# Patient Record
Sex: Female | Born: 1990 | Race: Black or African American | Hispanic: No | Marital: Single | Smoking: Never smoker
Health system: Southern US, Community
[De-identification: ages and names within clinical notes are randomized; demographics above are authoritative.]

## PROBLEM LIST (undated history)

## (undated) DIAGNOSIS — E119 Type 2 diabetes mellitus without complications: Secondary | ICD-10-CM

## (undated) DIAGNOSIS — I10 Essential (primary) hypertension: Secondary | ICD-10-CM

---

## 2019-04-21 ENCOUNTER — Emergency Department (HOSPITAL_COMMUNITY)
Admission: EM | Admit: 2019-04-21 | Discharge: 2019-04-21 | Disposition: A | Discharge status: Home / Self Care | Payer: Managed Care, Other (non HMO) | Attending: Emergency Medicine | Admitting: Emergency Medicine

## 2019-04-21 ENCOUNTER — Encounter (HOSPITAL_COMMUNITY): Payer: Self-pay | Admitting: Emergency Medicine

## 2019-04-21 ENCOUNTER — Other Ambulatory Visit: Payer: Self-pay

## 2019-04-21 ENCOUNTER — Emergency Department (HOSPITAL_COMMUNITY): Payer: Managed Care, Other (non HMO)

## 2019-04-21 DIAGNOSIS — R739 Hyperglycemia, unspecified: Secondary | ICD-10-CM

## 2019-04-21 DIAGNOSIS — E1310 Other specified diabetes mellitus with ketoacidosis without coma: Secondary | ICD-10-CM

## 2019-04-21 DIAGNOSIS — R000 Tachycardia, unspecified: Secondary | ICD-10-CM | POA: Diagnosis present

## 2019-04-21 DIAGNOSIS — E1165 Type 2 diabetes mellitus with hyperglycemia: Secondary | ICD-10-CM | POA: Insufficient documentation

## 2019-04-21 DIAGNOSIS — E109 Type 1 diabetes mellitus without complications: Secondary | ICD-10-CM

## 2019-04-21 DIAGNOSIS — Z79899 Other long term (current) drug therapy: Secondary | ICD-10-CM | POA: Diagnosis not present

## 2019-04-21 HISTORY — DX: Type 2 diabetes mellitus without complications: E11.9

## 2019-04-21 LAB — BLOOD GAS, VENOUS
Acid-base deficit: 1.3 mmol/L (ref 0.0–2.0)
Bicarbonate: 22.5 mmol/L (ref 20.0–28.0)
FIO2: 100
O2 Saturation: 72.1 %
Patient temperature: 98.6
pCO2, Ven: 37 mmHg — ABNORMAL LOW (ref 44.0–60.0)
pH, Ven: 7.401 (ref 7.250–7.430)
pO2, Ven: 39.4 mmHg (ref 32.0–45.0)

## 2019-04-21 LAB — CBC WITH DIFFERENTIAL/PLATELET
Abs Immature Granulocytes: 0.03 10*3/uL (ref 0.00–0.07)
Basophils Absolute: 0 10*3/uL (ref 0.0–0.1)
Basophils Relative: 0 %
Eosinophils Absolute: 0 10*3/uL (ref 0.0–0.5)
Eosinophils Relative: 0 %
HCT: 42.5 % (ref 36.0–46.0)
Hemoglobin: 14.6 g/dL (ref 12.0–15.0)
Immature Granulocytes: 0 %
Lymphocytes Relative: 16 %
Lymphs Abs: 1.7 10*3/uL (ref 0.7–4.0)
MCH: 28.6 pg (ref 26.0–34.0)
MCHC: 34.4 g/dL (ref 30.0–36.0)
MCV: 83.2 fL (ref 80.0–100.0)
Monocytes Absolute: 0.4 10*3/uL (ref 0.1–1.0)
Monocytes Relative: 4 %
Neutro Abs: 8.2 10*3/uL — ABNORMAL HIGH (ref 1.7–7.7)
Neutrophils Relative %: 80 %
Platelets: 409 10*3/uL — ABNORMAL HIGH (ref 150–400)
RBC: 5.11 MIL/uL (ref 3.87–5.11)
RDW: 11.7 % (ref 11.5–15.5)
WBC: 10.3 10*3/uL (ref 4.0–10.5)
nRBC: 0 % (ref 0.0–0.2)

## 2019-04-21 LAB — COMPREHENSIVE METABOLIC PANEL
ALT: 15 U/L (ref 0–44)
AST: 18 U/L (ref 15–41)
Albumin: 4.2 g/dL (ref 3.5–5.0)
Alkaline Phosphatase: 77 U/L (ref 38–126)
Anion gap: 11 (ref 5–15)
BUN: 11 mg/dL (ref 6–20)
CO2: 22 mmol/L (ref 22–32)
Calcium: 9.8 mg/dL (ref 8.9–10.3)
Chloride: 100 mmol/L (ref 98–111)
Creatinine, Ser: 0.78 mg/dL (ref 0.44–1.00)
GFR calc Af Amer: >60 mL/min (ref >60)
GFR calc non Af Amer: >60 mL/min (ref >60)
Glucose, Bld: 299 mg/dL — ABNORMAL HIGH (ref 70–99)
Potassium: 3.6 mmol/L (ref 3.5–5.1)
Sodium: 133 mmol/L — ABNORMAL LOW (ref 135–145)
Total Bilirubin: 0.9 mg/dL (ref 0.3–1.2)
Total Protein: 9.1 g/dL — ABNORMAL HIGH (ref 6.5–8.1)

## 2019-04-21 LAB — URINALYSIS, ROUTINE W REFLEX MICROSCOPIC
Bilirubin Urine: NEGATIVE
Glucose, UA: >=500 mg/dL — AB
Ketones, ur: 80 mg/dL — AB
Nitrite: NEGATIVE
Protein, ur: 100 mg/dL — AB
Specific Gravity, Urine: 1.022 (ref 1.005–1.030)
pH: 5 (ref 5.0–8.0)

## 2019-04-21 LAB — BETA-HYDROXYBUTYRIC ACID: Beta-Hydroxybutyric Acid: 1.49 mmol/L — ABNORMAL HIGH (ref 0.05–0.27)

## 2019-04-21 LAB — I-STAT BETA HCG BLOOD, ED (MC, WL, AP ONLY): I-stat hCG, quantitative: <5 m[IU]/mL (ref <5)

## 2019-04-21 LAB — CBG MONITORING, ED
Glucose-Capillary: 234 mg/dL — ABNORMAL HIGH (ref 70–99)
Glucose-Capillary: 238 mg/dL — ABNORMAL HIGH (ref 70–99)

## 2019-04-21 LAB — TSH: TSH: 0.461 u[IU]/mL (ref 0.350–4.500)

## 2019-04-21 MED ORDER — GLUCAGON EMERGENCY 1 MG IJ KIT: 1.0000 mg | PACK | INTRAMUSCULAR | 0 refills | Status: AC | PRN

## 2019-04-21 MED ORDER — INSULIN DETEMIR 100 UNIT/ML ~~LOC~~ SOLN: 30.0000 [IU] | Freq: Every day | SUBCUTANEOUS | 4 refills | Status: DC

## 2019-04-21 MED ORDER — INSULIN LISPRO 100 UNIT/ML ~~LOC~~ SOLN: 5.0000 [IU] | Freq: Three times a day (TID) | SUBCUTANEOUS | 4 refills | Status: DC

## 2019-04-21 MED ORDER — INSULIN ASPART 100 UNIT/ML ~~LOC~~ SOLN
5.0000 [IU] | Freq: Once | SUBCUTANEOUS | Status: AC
  Administered 2019-04-21: 5 [IU] via SUBCUTANEOUS
  Filled 2019-04-21: qty 0.05

## 2019-04-21 MED ORDER — SODIUM CHLORIDE 0.9 % IV BOLUS
1000.0000 mL | Freq: Once | INTRAVENOUS | Status: AC
  Administered 2019-04-21: 1000 mL via INTRAVENOUS

## 2019-04-21 NOTE — ED Provider Notes (Signed)
Portal DEPT Provider Note   CSN: [id number] Arrival date & time: 04/21/19  1429     History Chief Complaint  Patient presents with  . Tachycardia    Erica Buchanan is a 29 y.o. female with past medical history of DM 1 who presents today for evaluation of tachycardia and hyperglycemia. She reports that over the past few months she has been rationing her Humalog and Lantus to attempt to make them last longer. She states that her sugars have been running in the 2-3 100s today which is abnormal for her.  She reports that last Tuesday she was in the shower and felt like her heart was beating quickly.  She states that she checked her heart rate and it has been elevated intermittently since. She states that she is new to the area, however does not have a PCP here.  She denies any fevers or shortness of breath.  She does not take any birth control, no prior history of VTE.  She reports that she feels anxious overall.  No N/V/D.  No shortness of breath.   She states that she checked her ketones at home and they were positive.   HPI     Past Medical History:  Diagnosis Date  . Diabetes mellitus without complication ([place])    Type 1    There are no problems to display for this patient.   History reviewed. No pertinent surgical history.   OB History   No obstetric history on file.     No family history on file.  Social History   Tobacco Use  . Smoking status: Not on file  Substance Use Topics  . Alcohol use: Not on file  . Drug use: Not on file    Home Medications Prior to Admission medications   Medication Sig Start Date End Date Taking? Authorizing Provider  insulin detemir (LEVEMIR) 100 UNIT/ML injection Inject 30 Units into the skin at bedtime.   Yes [provider]  insulin lispro (HUMALOG) 100 UNIT/ML injection Inject 5-8 Units into the skin 3 (three) times daily before meals. Sliding scale: 5-8 units depending on the  size of the meal   Yes [provider]  Multiple Vitamin (MULTIVITAMIN ADULT PO) Take 1 tablet by mouth daily.   Yes [provider]    Allergies    Patient has no known allergies.  Review of Systems   Review of Systems  Constitutional: Negative for chills, fatigue and fever.  HENT: Negative for congestion.   Respiratory: Negative for cough, chest tightness and shortness of breath.   Cardiovascular: Positive for palpitations. Negative for chest pain and leg swelling.  Gastrointestinal: Negative for abdominal pain, diarrhea, nausea and vomiting.  Endocrine: Positive for polydipsia and polyuria.  Genitourinary: Negative for dysuria, pelvic pain and urgency.  Musculoskeletal: Negative for back pain and neck pain.  Neurological: Negative for weakness and headaches.  Psychiatric/Behavioral: Negative for confusion. The patient is not hyperactive.   All other systems reviewed and are negative.   Physical Exam Updated Vital Signs BP (!) 142/94   Pulse 98   Temp 97.7 F (36.5 C) (Oral)   Resp 16   LMP 03/24/2019   SpO2 100%   Physical Exam Vitals and nursing note reviewed.  Constitutional:      General: She is not in acute distress.    Appearance: She is well-developed. She is not diaphoretic.  HENT:     Head: Normocephalic and atraumatic.     Mouth/Throat:  Mouth: Mucous membranes are moist.  Eyes:     General: No scleral icterus.       Right eye: No discharge.        Left eye: No discharge.     Conjunctiva/sclera: Conjunctivae normal.  Cardiovascular:     Rate and Rhythm: Regular rhythm. Tachycardia present.     Pulses: Normal pulses.     Heart sounds: Normal heart sounds.  Pulmonary:     Effort: Pulmonary effort is normal. No respiratory distress.     Breath sounds: Normal breath sounds. No stridor.  Abdominal:     General: Abdomen is flat. There is no distension.     Tenderness: There is no abdominal tenderness.  Musculoskeletal:         General: No deformity.     Cervical back: Normal range of motion.     Right lower leg: No edema.     Left lower leg: No edema.  Skin:    General: Skin is warm and dry.  Neurological:     Mental Status: She is alert.     Motor: No abnormal muscle tone.  Psychiatric:        Attention and Perception: Attention normal.        Mood and Affect: Affect normal. Mood is anxious.        Behavior: Behavior normal.        Thought Content: Thought content normal.     ED Results / Procedures / Treatments   Labs (all labs ordered are listed, but only abnormal results are displayed) Labs Reviewed  URINALYSIS, ROUTINE W REFLEX MICROSCOPIC - Abnormal; Notable for the following components:      Result Value   APPearance CLOUDY (*)    Glucose, UA >=500 (*)    Hgb urine dipstick SMALL (*)    Ketones, ur 80 (*)    Protein, ur 100 (*)    Leukocytes,Ua SMALL (*)    Bacteria, UA RARE (*)    All other components within normal limits  COMPREHENSIVE METABOLIC PANEL - Abnormal; Notable for the following components:   Sodium 133 (*)    Glucose, Bld 299 (*)    Total Protein 9.1 (*)    All other components within normal limits  CBC WITH DIFFERENTIAL/PLATELET - Abnormal; Notable for the following components:   Platelets 409 (*)    Neutro Abs 8.2 (*)    All other components within normal limits  BLOOD GAS, VENOUS - Abnormal; Notable for the following components:   pCO2, Ven 37.0 (*)    All other components within normal limits  BETA-HYDROXYBUTYRIC ACID - Abnormal; Notable for the following components:   Beta-Hydroxybutyric Acid 1.49 (*)    All other components within normal limits  CBG MONITORING, ED - Abnormal; Notable for the following components:   Glucose-Capillary 234 (*)    All other components within normal limits  CBG MONITORING, ED - Abnormal; Notable for the following components:   Glucose-Capillary 238 (*)    All other components within normal limits  TSH  I-STAT BETA HCG BLOOD, ED ([facility],  [facility], [facility] ONLY)    EKG EKG Interpretation  Date/Time:  Sunday April 21 2019 14:50:41 EST Ventricular Rate:  114 PR Interval:    QRS Duration: 86 QT Interval:  344 QTC Calculation: 474 R Axis:   75 Text Interpretation: Sinus tachycardia Probable left atrial enlargement No acute changes No old tracing to compare Confirmed by Varney Biles [phone]) on 04/21/2019 4:15:46 PM   Radiology DG Chest  Port 1 View  Result Date: 04/21/2019 CLINICAL DATA:  Tachycardia and hyperglycemia EXAM: PORTABLE CHEST 1 VIEW COMPARISON:  None. FINDINGS: No consolidation, features of edema, pneumothorax, or effusion. Pulmonary vascularity is normally distributed. The cardiomediastinal contours are unremarkable. No acute osseous or soft tissue abnormality. IMPRESSION: No acute cardiopulmonary abnormality. Electronically Signed   By: Lovena Le M.D.   On: 04/21/2019 18:54    Procedures Procedures (including critical care time)  Medications Ordered in ED Medications  sodium chloride 0.9 % bolus 1,000 mL (0 mLs Intravenous Stopped 04/21/19 1728)  insulin aspart (novoLOG) injection 5 Units (5 Units Subcutaneous Given 04/21/19 1946)    ED Course  I have reviewed the triage vital signs and the nursing notes.  Pertinent labs & imaging results that were available during my care of the patient were reviewed by me and considered in my medical decision making (see chart for details).    MDM Rules/Calculators/A&P                     Patient presents today for evaluation of high heart rates. She reports that she has not been taking her full dose of insulin as directed as she has been rationing it over the past many months due to fear of running out.  She states that she uses a long-acting insulin and then will correct her blood sugar with a sliding scale insulin.  She does not consistently count carbs or dose insulin based on the food she is consuming.  On exam she is overall well-appearing.  She is slightly  tachycardic, however she reports feeling very anxious, stating that oftentimes when she is at the doctor her blood pressure and heart rate go up. 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  Labs are obtained and reviewed, CBC without significant abnormalities.  UA shows over 500 glucose with 80 ketones.  Urine has rare bacteria and 6-10 squamous epithelial cells.  She is not having dysuria, and I suspect this is contamination.  CMP shows glucose of 299 with a normal CO2.  VBG shows a pH of 7.401 with a PCO2 at 37 which is slightly low.  Beta hydroxybutyric acid is slightly elevated at 1.49.  Pregnancy test is negative.  She was treated with 1 L of IV fluids, after which she was able to eat and drink in the emergency room and she was given a subcu dose of NovoLog at her usual dose.  She does not take any birth control, does not have any leg swelling, chest pain, shortness of breath therefore low suspicion for VTE.  She is given a ambulatory referral to endocrinology as she does not currently have an endocrinologist.  She is given refills on her insulin with instructions on checking her blood sugar regularly and the importance of taking her insulin appropriately.  She is also given a glucagon emergency kit with instructions on when it should be used.   While she does have ketosis her pH is normal and she does not appear acidotic.  I discussed with patient that while she does have some signs of DKA she is not truly acidotic and therefore would most likely not benefit from an admission.  Discussed that she needs to continue her treatment at home with increased water intake, taking her insulin appropriately and checking her sugar.  Return precautions  were discussed with patient who states their understanding.  At the time of discharge patient denied any unaddressed  complaints or concerns.  Patient is agreeable for discharge home.  Note: Portions of this report may have been transcribed using voice recognition software. Every effort was made to ensure accuracy; however, inadvertent computerized transcription errors may be present  This patient was discussed with Dr. Kathrynn Humble.  Final Clinical Impression(s) / ED Diagnoses Final diagnoses:  Hyperglycemia  Ketosis due to diabetes ([place])  Type 1 diabetes mellitus without complication [facility])    Rx / DC Orders ED Discharge Orders         Ordered    Ambulatory referral to Endocrinology     04/21/19 2018           Ollen Gross 04/21/19 2108    Varney Biles, MD 04/21/19 2203

## 2019-04-21 NOTE — Discharge Instructions (Addendum)
Please keep a log of the carbs you are eating, your blood sugars, and how much insulin you are taking.   Today I gave you a prescription for a glucagon emergency kit.  This is something that you can teach someone else how to use.  This should be used if you are hypoglycemic and are not awake or able to safely take sugar by mouth.

## 2019-04-21 NOTE — ED Triage Notes (Signed)
Patient c/o tachycardia and hyperglycemia today. Hx Type 1 diabetes. States took extra insulin and last CBG was 181. Patient also adds hx anxiety. Denies SOB, chest pain, fever.

## 2019-05-09 ENCOUNTER — Other Ambulatory Visit: Payer: Self-pay

## 2019-05-13 ENCOUNTER — Other Ambulatory Visit: Payer: Self-pay

## 2019-05-13 ENCOUNTER — Ambulatory Visit (INDEPENDENT_AMBULATORY_CARE_PROVIDER_SITE_OTHER): Payer: Managed Care, Other (non HMO) | Admitting: Internal Medicine

## 2019-05-13 ENCOUNTER — Encounter: Payer: Self-pay | Admitting: Internal Medicine

## 2019-05-13 VITALS — BP 142/82 | HR 108 | Temp 99.1°F | Wt 190.2 lb

## 2019-05-13 DIAGNOSIS — E1065 Type 1 diabetes mellitus with hyperglycemia: Secondary | ICD-10-CM

## 2019-05-13 DIAGNOSIS — R739 Hyperglycemia, unspecified: Secondary | ICD-10-CM | POA: Diagnosis not present

## 2019-05-13 LAB — POCT GLYCOSYLATED HEMOGLOBIN (HGB A1C): Hemoglobin A1C: 10.5 % — AB (ref 4.0–5.6)

## 2019-05-13 MED ORDER — LANTUS SOLOSTAR 100 UNIT/ML ~~LOC~~ SOPN: 28.0000 [IU] | PEN_INJECTOR | Freq: Every day | SUBCUTANEOUS | 6 refills | Status: DC

## 2019-05-13 MED ORDER — INSULIN LISPRO (1 UNIT DIAL) 100 UNIT/ML (KWIKPEN): 10.0000 [IU] | PEN_INJECTOR | Freq: Three times a day (TID) | SUBCUTANEOUS | 6 refills | Status: DC

## 2019-05-13 NOTE — Patient Instructions (Addendum)
-   Stop Levemir - Start Lantus 28 units daily  - Start Humalog at 10 units with each meal  - Humalog correctional insulin: ADD extra units on insulin to your meal-time humalog dose if your blood sugars are higher than  Use the scale below to help guide you:   Blood sugar before meal Number of units to inject  Less than 165 0 unit  166 -  200 1 units  201 -  235 2 units  236 -  270 3 units  271 -  305 4 units  306 -  340 5 units  341 -  375 6 units  376-  410 7 units     Choose healthy, lower carb lower calorie snacks: toss salad, vegetables, cottage cheese, peanut butter, low fat cheese / string cheese, lower sodium deli meat, tuna salad or chicken salad    HOW TO TREAT LOW BLOOD SUGARS (Blood sugar LESS THAN 70 MG/DL)  Please follow the RULE OF 15 for the treatment of hypoglycemia treatment (when your (blood sugars are less than 70 mg/dL)    STEP 1: Take 15 grams of carbohydrates when your blood sugar is low, which includes:   3-4 GLUCOSE TABS  OR  3-4 OZ OF JUICE OR REGULAR SODA OR  ONE TUBE OF GLUCOSE GEL     STEP 2: RECHECK blood sugar in 15 MINUTES STEP 3: If your blood sugar is still low at the 15 minute recheck --> then, go back to STEP 1 and treat AGAIN with another 15 grams of carbohydrates.

## 2019-05-13 NOTE — Progress Notes (Signed)
Name: Erica Buchanan  MRN/ DOB: [id number], [birth year]   Age/ Sex: 29 y.o., female    PCP: Patient, No Pcp Per   Reason for Endocrinology Evaluation: Type 1 Diabetes Mellitus     Date of Initial Endocrinology Visit: 05/14/2019     PATIENT IDENTIFIER: Erica Buchanan is a 29 y.o. female with a past medical history of T1DM. The patient presented for initial endocrinology clinic visit on 05/14/2019 for consultative assistance with her diabetes management.    HPI: Erica Buchanan was    Diagnosed with T1DM at age 30 Prior Medications tried/Intolerance: Metformin - nausea Currently checking blood sugars 2 x / day Hypoglycemia episodes : no           Hemoglobin A1c has ranged from  In7's.% , peaking at 9.0%  Patient required assistance for hypoglycemia: no  Patient has required hospitalization within the last 1 year from hyper or hypoglycemia:04/2019 due to high BP No prior diagnosis of DKA  In terms of diet, the patient 2 meals a day , snacks at times. Drinks sweet tea at times  Moved from [place], [place] since 2018   Works at [employer] with brother.   HOME DIABETES REGIMEN: Levemir  30 units  Humalog 3-8 units SS   Statin:no  ACE-I/ARB:no  Prior Diabetic Education: no   METER DOWNLOAD SUMMARY: Did not bring    DIABETIC COMPLICATIONS: Microvascular complications:    Denies: neuropathy, CKD, retinopathy   Last eye exam: Completed 2017  Macrovascular complications:    Denies: CAD, PVD, CVA   PAST HISTORY: Past Medical History:  Past Medical History:  Diagnosis Date  . Diabetes mellitus without complication ([place])    Type 1    Past Surgical History: No past surgical history on file.   Social History:  reports that she has never smoked. She has never used smokeless tobacco. She reports current alcohol use. She reports that she does not use drugs. Family History:  Family History  Problem Relation Age of Onset  . Healthy Mother    . Diabetes Father      HOME MEDICATIONS: Allergies as of 05/13/2019   No Known Allergies     Medication List       Accurate as of May 13, 2019 11:59 PM. If you have any questions, ask your nurse or doctor.        STOP taking these medications   insulin detemir 100 UNIT/ML injection Commonly known as: LEVEMIR Stopped by: Dorita Sciara, MD   insulin lispro 100 UNIT/ML injection Commonly known as: HUMALOG Replaced by: insulin lispro 100 UNIT/ML KwikPen Stopped by: Dorita Sciara, MD     TAKE these medications   Glucagon Emergency 1 MG Kit Inject 1 mg as directed as needed (Hypoglycemia with altered level of consciousness, then call 911).   insulin lispro 100 UNIT/ML KwikPen Commonly known as: HumaLOG KwikPen Inject 0.1 mLs (10 Units total) into the skin 3 (three) times daily. Max daily dose of 55 units Replaces: insulin lispro 100 UNIT/ML injection Started by: Dorita Sciara, MD   Lantus SoloStar 100 UNIT/ML Solostar Pen Generic drug: Insulin Glargine Inject 28 Units into the skin daily. Started by: Dorita Sciara, MD   MULTIVITAMIN ADULT PO Take 1 tablet by mouth daily.        ALLERGIES: No Known Allergies   REVIEW OF SYSTEMS: A comprehensive ROS was conducted with the patient and is negative except as per HPI and below:  ROS    OBJECTIVE:   VITAL SIGNS: BP (!) 142/82 (BP Location: Left Arm, Patient Position: Sitting, Cuff Size: Normal)   Pulse (!) 108   Temp 99.1 F (37.3 C)   Wt 190 lb 3.2 oz (86.3 kg)   SpO2 98%   BMI 30.70 kg/m    PHYSICAL EXAM:  General: Pt appears well and is in NAD  HEENT:  Eyes: External eye exam normal without stare, lid lag or exophthalmos.  EOM intact.    Neck: General: Supple without adenopathy or carotid bruits. Thyroid: Thyroid size enlarged .No nodules appreciated.  Lungs: Clear with good BS bilat with no rales, rhonchi, or wheezes  Heart: RRR with normal S1 and S2 and no gallops; no  murmurs; no rub  Abdomen: Normoactive bowel sounds, soft, nontender, without masses or organomegaly palpable  Extremities:  Lower extremities - No pretibial edema. No lesions.  Skin: Normal texture and temperature to palpation.   Neuro: MS is good with appropriate affect, pt is alert and Ox3    DM foot exam: 05/13/2019  The skin of the feet is intact without sores or ulcerations. The pedal pulses are 2+ on right and 2+ on left. The sensation is intact to a screening 5.07, 10 gram monofilament bilaterally   DATA REVIEWED:  Lab Results  Component Value Date   HGBA1C 10.5 (A) 05/13/2019   Lab Results  Component Value Date   CREATININE 0.78 04/21/2019    ASSESSMENT / PLAN / RECOMMENDATIONS:   1) Type 1 Diabetes Mellitus, Poorly controlled, complicated by insulin resistance  (double diabetes) - Most recent A1c of 10.5%. Goal A1c < 7.0 %.    Plan: GENERAL: I have discussed with the patient the pathophysiology of diabetes. We went over the natural progression of the disease. We talked about both insulin resistance and insulin deficiency. We stressed the importance of lifestyle changes including diet and exercise. I explained the complications associated with diabetes including retinopathy, nephropathy, neuropathy as well as increased risk of cardiovascular disease. We went over the benefit seen with glycemic control.    I explained to the patient that diabetic patients are at higher than normal risk for amputations. Discussed pharmacokinetics of basal/bolus insulin and the importance of taking prandial insulin with meals.   We also discussed avoiding sugar-sweetened beverages and snacks, when possible.   We will consider adding metformin in the future to improve her insulin resistance  We discussed the importance of having planned pregnancies in the future, we discussed the importance of having an A1c at 7% prior to considering pregnancy.  She is not interested in conception at this  time  We will stop the Levemir and start Lantus due to improved half-life.  MEDICATIONS: - Stop Levemir - Start Lantus 28 units daily  - Start Humalog at 10 units with each meal  - CF: Humalog (BG-130/35)   EDUCATION / INSTRUCTIONS:  BG monitoring instructions: Patient is instructed to check her blood sugars 4 times a day, before meals and bedtime.  Call [facility] if: BG persistently < 70 or > 300. . I reviewed the Rule of 15 for the treatment of hypoglycemia in detail with the patient. Literature supplied.   2) Diabetic complications:   Eye: Does not have known diabetic retinopathy.  The patient urge to have an eye exam ASAP  Neuro/ Feet: Does not have known diabetic peripheral neuropathy.  Renal: Patient does not have known baseline CKD. She is not on an ACEI/ARB at present.  At least 45 minutes were spent with the patient today    Signed electronically by: Mack Guise, MD  [facility]  [address] [place], [place] [address] Phone: [phone] FAX: [phone]   CC: Patient, No Pcp Per No address on file Phone: None  Fax: None    Return to Endocrinology clinic as below: Future Appointments  Date Time Provider [place]  08/12/2019  1:40 PM Thetis Schwimmer, Melanie Crazier, MD [facility] None

## 2019-05-14 ENCOUNTER — Encounter: Payer: Self-pay | Admitting: Internal Medicine

## 2019-05-14 DIAGNOSIS — E1065 Type 1 diabetes mellitus with hyperglycemia: Secondary | ICD-10-CM | POA: Insufficient documentation

## 2019-06-17 LAB — HM DIABETES EYE EXAM

## 2019-08-12 ENCOUNTER — Encounter: Payer: Self-pay | Admitting: Internal Medicine

## 2019-08-12 ENCOUNTER — Ambulatory Visit (INDEPENDENT_AMBULATORY_CARE_PROVIDER_SITE_OTHER): Payer: Managed Care, Other (non HMO) | Admitting: Internal Medicine

## 2019-08-12 ENCOUNTER — Other Ambulatory Visit: Payer: Self-pay

## 2019-08-12 VITALS — BP 146/88 | HR 96 | Temp 99.0°F | Ht 66.0 in | Wt 191.2 lb

## 2019-08-12 DIAGNOSIS — E103293 Type 1 diabetes mellitus with mild nonproliferative diabetic retinopathy without macular edema, bilateral: Secondary | ICD-10-CM | POA: Insufficient documentation

## 2019-08-12 DIAGNOSIS — E1065 Type 1 diabetes mellitus with hyperglycemia: Secondary | ICD-10-CM

## 2019-08-12 LAB — POCT GLYCOSYLATED HEMOGLOBIN (HGB A1C): Hemoglobin A1C: 8.4 % — AB (ref 4.0–5.6)

## 2019-08-12 NOTE — Patient Instructions (Signed)
-   Lantus 28 units daily  - Humalog 10 units with each meal  - Humalog for snack : Insulin to carb ratio of 1:15 ( means for every 15 grams of carbohydrate, give yourself 1 units of Humalog )  - Humalog correctional insulin: ADD extra units on insulin to your meal-time humalog dose if your blood sugars are higher than  Use the scale below to help guide you:   Blood sugar before meal Number of units to inject  Less than 165 0 unit  166 -  200 1 units  201 -  235 2 units  236 -  270 3 units  271 -  305 4 units  306 -  340 5 units  341 -  375 6 units  376-  410 7 units     Choose healthy, lower carb lower calorie snacks: toss salad, vegetables, cottage cheese, peanut butter, low fat cheese / string cheese, lower sodium deli meat, tuna salad or chicken salad    HOW TO TREAT LOW BLOOD SUGARS (Blood sugar LESS THAN 70 MG/DL)  Please follow the RULE OF 15 for the treatment of hypoglycemia treatment (when your (blood sugars are less than 70 mg/dL)    STEP 1: Take 15 grams of carbohydrates when your blood sugar is low, which includes:   3-4 GLUCOSE TABS  OR  3-4 OZ OF JUICE OR REGULAR SODA OR  ONE TUBE OF GLUCOSE GEL     STEP 2: RECHECK blood sugar in 15 MINUTES STEP 3: If your blood sugar is still low at the 15 minute recheck --> then, go back to STEP 1 and treat AGAIN with another 15 grams of carbohydrates.

## 2019-08-12 NOTE — Progress Notes (Signed)
 Name: Erica Buchanan  Age/ Sex: 29 y.o., female   MRN/ DOB: [id number], 01/18/1991     PCP: Patient, No Pcp Per   Reason for Endocrinology Evaluation: Type 1 Diabetes Mellitus  Initial Endocrine Consultative Visit: 05/13/2019    PATIENT IDENTIFIER: [name] is a 29 y.o. female with a past medical history of T1DM. The patient has followed with Endocrinology clinic since 05/13/2019 for consultative assistance with management of her diabetes.  DIABETIC HISTORY:  [name] was diagnosed with T1DM at age 11. Metformin caused Nausea. Her hemoglobin A1c has ranged from 7's.% , peaking at 9.0%   Works at [employer]   Lives with brother.    SUBJECTIVE:   During the last visit (05/13/2019): A1c 10.5 %, we adjusted MDI regimen   Today (08/12/2019): [name]  She checks her blood sugars 2times daily, preprandial to breakfast and dinner. The patient has not had hypoglycemic episodes since the last clinic visit.       HOME DIABETES REGIMEN:  Lantus 28 units daily  Humalog 10 units TIDQAC-  Taking 8 units  CF (BG-130/35)       METER DOWNLOAD SUMMARY: Date range evaluated: 4/11-5/01/2020 Fingerstick Blood Glucose Tests = 60 Average Number Tests/Day = 2 Overall Mean FS Glucose = 191 Standard Deviation = 53  BG Ranges: Low = 99 High = 356   Hypoglycemic Events/30 Days: BG < 50 = 0 Episodes of symptomatic severe hypoglycemia = 0    DIABETIC COMPLICATIONS: Microvascular complications:   Mild DR , no edema   Denies: neuropathy, CKD  Last eye exam: Completed 06/17/2019  Macrovascular complications:    Denies: CAD, PVD, CVA    HISTORY:  Past Medical History:  Past Medical History:  Diagnosis Date  . Diabetes mellitus without complication (HCC)    Type 1    Past Surgical History: No past surgical history on file.  Social History:  reports that she has never smoked. She has never used smokeless tobacco. She reports  current alcohol use. She reports that she does not use drugs. Family History:  Family History  Problem Relation Age of Onset  . Healthy Mother   . Diabetes Father      HOME MEDICATIONS: Allergies as of 08/12/2019   No Known Allergies     Medication List       Accurate as of Aug 12, 2019  2:59 PM. If you have any questions, ask your nurse or doctor.        Glucagon Emergency 1 MG Kit Inject 1 mg as directed as needed (Hypoglycemia with altered level of consciousness, then call 911).   insulin lispro 100 UNIT/ML KwikPen Commonly known as: HumaLOG KwikPen Inject 0.1 mLs (10 Units total) into the skin 3 (three) times daily. Max daily dose of 55 units What changed: how much to take   Lantus SoloStar 100 UNIT/ML Solostar Pen Generic drug: insulin glargine Inject 28 Units into the skin daily.   MULTIVITAMIN ADULT PO Take 1 tablet by mouth daily.        OBJECTIVE:   Vital Signs: BP (!) 146/88 (BP Location: Left Arm, Patient Position: Sitting, Cuff Size: Large)   Pulse 96   Temp 99 F (37.2 C)   Ht 5' 6" (1.676 m)   Wt 191 lb 3.2 oz (86.7 kg)   LMP 08/04/2019 (Exact Date)   SpO2 99%   BMI 30.86 kg/m   Wt Readings from Last 3 Encounters:  08/12/19 191 lb 3.2 oz (  86.7 kg)  05/13/19 190 lb 3.2 oz (86.3 kg)  04/21/19 180 lb (81.6 kg)     Exam: General: Pt appears well and is in NAD  Neck: General: Supple without adenopathy. Thyroid: Thyroid size normal.  No goiter or nodules appreciated. No thyroid bruit.  Lungs: Clear with good BS bilat with no rales, rhonchi, or wheezes  Heart: RRR with normal S1 and S2 and no gallops; no murmurs; no rub  Abdomen: Normoactive bowel sounds, soft, nontender, without masses or organomegaly palpable  Extremities: No pretibial edema.   Neuro: MS is good with appropriate affect, pt is alert and Ox3    DM foot exam: 05/13/2019  The skin of the feet is intact without sores or ulcerations. The pedal pulses are 2+ on right and 2+ on  left. The sensation is intact to a screening 5.07, 10 gram monofilament bilaterally     DATA REVIEWED:  Lab Results  Component Value Date   HGBA1C 8.4 (A) 08/12/2019   HGBA1C 10.5 (A) 05/13/2019   Lab Results  Component Value Date   CREATININE 0.78 04/21/2019     ASSESSMENT / PLAN / RECOMMENDATIONS:   1 ) Type 1 Diabetes Mellitus, Poorly controlled with improving glycemic control, complicated by insulin resistance  (double diabetes) - Most recent A1c of 8.4 %. Goal A1c < 7.0 %.   - A1c down from 10.5 %  - Pt with fear of hypoglycemia even when her BG's are in the normal range, which results in her eating/drinking and BG's remaining > 180 mg/dL .  I counseled her about the definition of hypoglycemia with BG's < 70 mg/dL and how to correct tight BG's if she is not comfortable  - She has been snacking at night, she will be provided with a humalog dose for that    MEDICATIONS: - Lantus 28 units daily  - Humalog 10 units with each meal  - Humalog for snack :I: C ratio of 1:15  - CF : Humalog ( BG - 130/35)  EDUCATION / INSTRUCTIONS:  BG monitoring instructions: Patient is instructed to check her blood sugars 3 times a day, before meals and bedtime   Call [facility] if: BG persistently < 70 or > 300. . I reviewed the Rule of 15 for the treatment of hypoglycemia in detail with the patient. Literature supplied.    2) Diabetic complications:   Eye: Does have known diabetic retinopathy.   Neuro/ Feet: Does not have known diabetic peripheral neuropathy.  Renal: Patient does not have known baseline CKD. She is not on an ACEI/ARB at present.  F/U in 3 months    Signed electronically by: Mack Guise, MD  [facility] Endocrinology  [facility] [address] [place], [place] [address] Phone: [phone] FAX: [phone]   CC: Patient, No Pcp Per No address on file Phone: None  Fax: None  Return to Endocrinology  clinic as below: Future Appointments  Date Time Provider [place]  11/18/2019  1:40 PM Willo Yoon, Melanie Crazier, MD [facility] None

## 2019-11-04 ENCOUNTER — Other Ambulatory Visit (HOSPITAL_COMMUNITY)
Admission: RE | Admit: 2019-11-04 | Discharge: 2019-11-04 | Disposition: A | Discharge status: Home / Self Care | Payer: Managed Care, Other (non HMO) | Source: Ambulatory Visit | Attending: Physician Assistant | Admitting: Physician Assistant

## 2019-11-04 DIAGNOSIS — Z124 Encounter for screening for malignant neoplasm of cervix: Secondary | ICD-10-CM | POA: Diagnosis not present

## 2019-11-06 LAB — CYTOLOGY - PAP: Diagnosis: NEGATIVE

## 2019-11-18 ENCOUNTER — Ambulatory Visit: Payer: Managed Care, Other (non HMO) | Admitting: Internal Medicine

## 2019-12-22 NOTE — Progress Notes (Signed)
Name: Erica Buchanan  Age/ Sex: 29 y.o., female   MRN/ DOB: [id number], [birth year]     PCP: Johna Roles, PA   Reason for Endocrinology Evaluation: Type 1 Diabetes Mellitus  Initial Endocrine Consultative Visit: 05/13/2019    PATIENT IDENTIFIER: Erica Buchanan is a 29 y.o. female with a past medical history of T1DM, ADHD. The patient has followed with Endocrinology clinic since 05/13/2019 for consultative assistance with management of her diabetes.  DIABETIC HISTORY:  Erica Buchanan was diagnosed with T1DM at age 39. Metformin caused Nausea. Her hemoglobin A1c has ranged from 7's.% , peaking at 9.0%   Works at [employer] with brother.   Is seeing a therapist  SUBJECTIVE:   During the last visit (08/12/2019): A1c 8.4 %, we adjusted MDI regimen      Today (12/23/2019): Erica Buchanan  She checks her blood sugars 2 times daily, preprandial to breakfast and dinner. The patient has not had hypoglycemic episodes since the last clinic visit.       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  Social History:  reports that she has never smoked. She has never used smokeless tobacco. She reports current alcohol use. She reports that she does not use drugs. Family History:  Family History  Problem Relation Age of Onset   Healthy Mother    Diabetes Father      HOME MEDICATIONS: Allergies as of 12/23/2019   No Known Allergies     Medication List       Accurate as of December 23, 2019  3:24 PM. If you have any questions, ask your nurse or doctor.        Glucagon Emergency 1 MG Kit Inject 1 mg as directed as needed (Hypoglycemia with altered level of consciousness, then call 911).   insulin lispro 100 UNIT/ML KwikPen Commonly known as: HumaLOG KwikPen Inject 0.1 mLs (10 Units total) into the skin 3 (three) times daily. Max daily dose of 55 units What changed: how much to take   Lantus SoloStar 100 UNIT/ML Solostar Pen Generic drug: insulin glargine Inject 28 Units into the skin daily.   MULTIVITAMIN ADULT PO Take 1 tablet by mouth daily.        OBJECTIVE:   Vital Signs: BP (!) 144/90 (BP Location: Right Arm, Patient Position: Sitting, Cuff Size: Normal)    Pulse (!) 105    Temp 99.5 F (37.5 C) (Oral)    Ht 5'  6" (1.676 m)    Wt 198 lb 3.2 oz (89.9 kg)    SpO2 99%    BMI 31.99 kg/m   Wt Readings from Last 3 Encounters:  12/23/19 198 lb 3.2 oz (89.9 kg)  08/12/19 191 lb 3.2 oz (86.7 kg)  05/13/19 190 lb 3.2 oz (86.3 kg)     Exam: General: Pt appears well and is in NAD  Neck: General: Supple without adenopathy. Thyroid: Thyroid size normal.  No goiter or nodules appreciated. No thyroid bruit.  Lungs: Clear with good BS bilat with no rales, rhonchi, or wheezes  Heart: RRR with normal S1 and S2 and no gallops; no murmurs; no rub  Abdomen: Normoactive bowel sounds, soft, nontender, without masses or organomegaly palpable  Extremities: No pretibial edema.   Neuro: MS is good with appropriate affect, pt is alert  and Ox3    DM foot exam: 05/13/2019  The skin of the feet is intact without sores or ulcerations. The pedal pulses are 2+ on right and 2+ on left. The sensation is intact to a screening 5.07, 10 gram monofilament bilaterally     DATA REVIEWED:  Lab Results  Component Value Date   HGBA1C 8.1 (A) 12/23/2019   HGBA1C 8.4 (A) 08/12/2019   HGBA1C 10.5 (A) 05/13/2019   Lab Results  Component Value Date   CREATININE 0.78 04/21/2019     ASSESSMENT / PLAN / RECOMMENDATIONS:   1 ) Type 1 Diabetes Mellitus, Poorly controlled with improving glycemic control, complicated by insulin resistance  (double diabetes) - Most recent A1c of 8.1 %. Goal A1c < 7.0 %.   -Patient continues with variable glucose readings, she is reluctant to usual full amount of prandial insulin due to her fear of hypoglycemia, there is no history of any recent hypoglycemic episodes. -She was given an NovoLog dose of I:C  Ratio of 1:15, that she has not been using but uses a standing dose of 3 units with snack. -She is supposed to be taking NovoLog 10 units with each meal but instead she uses between 6 and 8 units and meal. -In review of her glucose meter today, there was an incident where her BG went from 230-82 mg/dL, based on that information it is clear to me that she has used a correction dose of NovoLog, upon further questioning the patient had made her own gas and correction.  -I have encouraged the patient to use the recommended doses of insulin, and if she continues to haphazardly give herself NovoLog she she will continue to be at risk for hypo and hyperglycemia. - Declines pump use  -I am going to prescribe Dexcom for her -We discussed add on therapy with GLP-1 agonist, she agreed to starting Rybelsus, we cautioned against GI side effects   MEDICATIONS: -Continue Lantus 28 units daily  -Increase Humalog to 10 units with each meal  - CF : Humalog ( BG - 130/35) -Start Rybelsus 3 mg, 1 tablet daily with  breakfast  EDUCATION / INSTRUCTIONS:  BG monitoring instructions: Patient is instructed to check her blood sugars 3 times a day, before meals and bedtime   Call [facility] if: BG persistently < 70 .  I reviewed the Rule of 15 for the treatment of hypoglycemia in detail with the patient. Literature supplied.    2) Diabetic complications:   Eye: Does have known diabetic retinopathy.   Neuro/ Feet: Does not have known diabetic peripheral neuropathy.  Renal: Patient does not have known baseline CKD. She  is not on an ACEI/ARB at present.  F/U in 3 months    Signed electronically by: Mack Guise, MD  [facility] Endocrinology  [facility] [address] [place], [place] [address] Phone: [phone] FAX: [phone]   CC: Johna Roles, [place] [address] [place] [address] Phone: [phone]  Fax: [phone]  Return to Endocrinology clinic as below: No future appointments.

## 2019-12-23 ENCOUNTER — Other Ambulatory Visit: Payer: Self-pay

## 2019-12-23 ENCOUNTER — Ambulatory Visit (INDEPENDENT_AMBULATORY_CARE_PROVIDER_SITE_OTHER): Payer: Managed Care, Other (non HMO) | Admitting: Internal Medicine

## 2019-12-23 VITALS — BP 144/90 | HR 105 | Temp 99.5°F | Ht 66.0 in | Wt 198.2 lb

## 2019-12-23 DIAGNOSIS — E1065 Type 1 diabetes mellitus with hyperglycemia: Secondary | ICD-10-CM

## 2019-12-23 DIAGNOSIS — E103293 Type 1 diabetes mellitus with mild nonproliferative diabetic retinopathy without macular edema, bilateral: Secondary | ICD-10-CM | POA: Diagnosis not present

## 2019-12-23 LAB — POCT GLYCOSYLATED HEMOGLOBIN (HGB A1C): Hemoglobin A1C: 8.1 % — AB (ref 4.0–5.6)

## 2019-12-23 MED ORDER — RYBELSUS 3 MG PO TABS: 3.0000 mg | ORAL_TABLET | Freq: Every day | ORAL | 1 refills | Status: DC

## 2019-12-23 MED ORDER — DEXCOM G6 SENSOR MISC: 1.0000 | 12 refills | Status: DC

## 2019-12-23 MED ORDER — CONTOUR NEXT TEST VI STRP: ORAL_STRIP | 12 refills | Status: AC

## 2019-12-23 MED ORDER — DEXCOM G6 TRANSMITTER MISC: 1.0000 | 3 refills | Status: DC

## 2019-12-23 NOTE — Patient Instructions (Addendum)
-   Rybelsus 3 mg , 1 tablet daily with Breakfast -Lantus 28 units daily  - Humalog 10 units with each meal  - Humalog correctional insulin: ADD extra units on insulin to your meal-time humalog dose if your blood sugars are higher than  Use the scale below to help guide you:   Blood sugar before meal Number of units to inject  Less than 165 0 unit  166 -  200 1 units  201 -  235 2 units  236 -  270 3 units  271 -  305 4 units  306 -  340 5 units  341 -  375 6 units  376-  410 7 units     Choose healthy, lower carb lower calorie snacks: toss salad, vegetables, cottage cheese, peanut butter, low fat cheese / string cheese, lower sodium deli meat, tuna salad or chicken salad    HOW TO TREAT LOW BLOOD SUGARS (Blood sugar LESS THAN 70 MG/DL)  Please follow the RULE OF 15 for the treatment of hypoglycemia treatment (when your (blood sugars are less than 70 mg/dL)    STEP 1: Take 15 grams of carbohydrates when your blood sugar is low, which includes:   3-4 GLUCOSE TABS  OR  3-4 OZ OF JUICE OR REGULAR SODA OR  ONE TUBE OF GLUCOSE GEL     STEP 2: RECHECK blood sugar in 15 MINUTES STEP 3: If your blood sugar is still low at the 15 minute recheck --> then, go back to STEP 1 and treat AGAIN with another 15 grams of carbohydrates.

## 2019-12-24 ENCOUNTER — Encounter: Payer: Self-pay | Admitting: Internal Medicine

## 2020-01-02 ENCOUNTER — Telehealth: Payer: Self-pay

## 2020-01-02 NOTE — Telephone Encounter (Signed)
Almira Coaster at [facility] office called state pt PCP [place] Pink NP referred to them & pt has been seeing the Dietician group regularly.  Almira Coaster will be faxing record request for DOS 12/23/2019 office visit with [facility]. Please be advised.

## 2020-01-02 NOTE — Telephone Encounter (Signed)
Medical records request has been sent to medical records.

## 2020-01-03 ENCOUNTER — Telehealth: Payer: Self-pay

## 2020-01-03 NOTE — Telephone Encounter (Signed)
PA HAS BEEN SENT

## 2020-01-03 NOTE — Telephone Encounter (Signed)
New message    Prior authorization Semaglutide (RYBELSUS) 3 MG TABS. Will schedule a follow up call in a few days

## 2020-01-17 ENCOUNTER — Other Ambulatory Visit: Payer: Self-pay | Admitting: Internal Medicine

## 2020-01-17 MED ORDER — OZEMPIC (0.25 OR 0.5 MG/DOSE) 2 MG/1.5ML ~~LOC~~ SOPN: 0.2500 mg | PEN_INJECTOR | SUBCUTANEOUS | 6 refills | Status: DC

## 2020-01-19 MED ORDER — OZEMPIC (0.25 OR 0.5 MG/DOSE) 2 MG/1.5ML ~~LOC~~ SOPN: 0.2500 mg | PEN_INJECTOR | SUBCUTANEOUS | 6 refills | Status: DC

## 2020-01-19 NOTE — Addendum Note (Signed)
Addended by: Judd Gaudier on: 01/19/2020 04:43 PM   Modules accepted: Orders

## 2020-03-20 ENCOUNTER — Other Ambulatory Visit: Payer: Self-pay | Admitting: Internal Medicine

## 2020-03-20 MED ORDER — LANTUS SOLOSTAR 100 UNIT/ML ~~LOC~~ SOPN: 28.0000 [IU] | PEN_INJECTOR | Freq: Every day | SUBCUTANEOUS | 1 refills | Status: DC

## 2020-04-27 ENCOUNTER — Ambulatory Visit: Payer: Managed Care, Other (non HMO) | Admitting: Internal Medicine

## 2020-05-18 ENCOUNTER — Encounter: Payer: Self-pay | Admitting: Internal Medicine

## 2020-05-18 ENCOUNTER — Ambulatory Visit (INDEPENDENT_AMBULATORY_CARE_PROVIDER_SITE_OTHER): Payer: Managed Care, Other (non HMO) | Admitting: Internal Medicine

## 2020-05-18 ENCOUNTER — Other Ambulatory Visit: Payer: Self-pay

## 2020-05-18 VITALS — BP 130/82 | HR 91 | Ht 66.0 in | Wt 200.2 lb

## 2020-05-18 DIAGNOSIS — E1065 Type 1 diabetes mellitus with hyperglycemia: Secondary | ICD-10-CM

## 2020-05-18 DIAGNOSIS — E103293 Type 1 diabetes mellitus with mild nonproliferative diabetic retinopathy without macular edema, bilateral: Secondary | ICD-10-CM | POA: Diagnosis not present

## 2020-05-18 LAB — POCT GLYCOSYLATED HEMOGLOBIN (HGB A1C): Hemoglobin A1C: 7.6 % — AB (ref 4.0–5.6)

## 2020-05-18 MED ORDER — OZEMPIC (0.25 OR 0.5 MG/DOSE) 2 MG/1.5ML ~~LOC~~ SOPN
0.5000 mg | PEN_INJECTOR | SUBCUTANEOUS | 3 refills | Status: DC
Start: 2020-05-18 — End: 2022-03-15

## 2020-05-18 MED ORDER — INSULIN PEN NEEDLE 32G X 4 MM MISC: 1.0000 | Freq: Four times a day (QID) | 3 refills | Status: DC

## 2020-05-18 MED ORDER — INSULIN LISPRO (1 UNIT DIAL) 100 UNIT/ML (KWIKPEN)
10.0000 [IU] | PEN_INJECTOR | Freq: Three times a day (TID) | SUBCUTANEOUS | 2 refills | Status: DC
Start: 2020-05-18 — End: 2020-08-13

## 2020-05-18 MED ORDER — LANTUS SOLOSTAR 100 UNIT/ML ~~LOC~~ SOPN
28.0000 [IU] | PEN_INJECTOR | Freq: Every day | SUBCUTANEOUS | 2 refills | Status: DC
Start: 2020-05-18 — End: 2021-03-17

## 2020-05-18 NOTE — Patient Instructions (Addendum)
-   Ozempic 0.25 mg ONCE Weekly, for 6 weeks, then increase to 0.5 mg Weekly  - Continue Lantus 28 units daily  - Humalog 10 units with each meal but decrease to 8 units if you start Ozempic - Humalog correctional insulin: ADD extra units on insulin to your meal-time humalog dose if your blood sugars are higher than  Use the scale below to help guide you:   Blood sugar before meal Number of units to inject  Less than 165 0 unit  166 -  200 1 units  201 -  235 2 units  236 -  270 3 units  271 -  305 4 units  306 -  340 5 units  341 -  375 6 units  376-  410 7 units      HOW TO TREAT LOW BLOOD SUGARS (Blood sugar LESS THAN 70 MG/DL)  Please follow the RULE OF 15 for the treatment of hypoglycemia treatment (when your (blood sugars are less than 70 mg/dL)    STEP 1: Take 15 grams of carbohydrates when your blood sugar is low, which includes:   3-4 GLUCOSE TABS  OR  3-4 OZ OF JUICE OR REGULAR SODA OR  ONE TUBE OF GLUCOSE GEL     STEP 2: RECHECK blood sugar in 15 MINUTES STEP 3: If your blood sugar is still low at the 15 minute recheck --> then, go back to STEP 1 and treat AGAIN with another 15 grams of carbohydrates.

## 2020-05-18 NOTE — Progress Notes (Signed)
Name: Erica Buchanan  Age/ Sex: 30 y.o., female   MRN/ DOB: [id number], [birth year]     PCP: Johna Roles, PA   Reason for Endocrinology Evaluation: Type 1 Diabetes Mellitus  Initial Endocrine Consultative Visit: 05/13/2019    PATIENT IDENTIFIER: Erica Buchanan is a 30 y.o. female with a past medical history of T1DM, ADHD. The patient has followed with Endocrinology clinic since 05/13/2019 for consultative assistance with management of her diabetes.  DIABETIC HISTORY:  Erica Buchanan was diagnosed with T1DM at age 82. Metformin caused Nausea. Her hemoglobin A1c has ranged from 7's.% , peaking at 9.0%   Works at [employer] with brother.   Is seeing a therapist   Insurance did not cover Rybelsus   SUBJECTIVE:   During the last visit (12/23/2019): A1c 8.1 %, we adjusted MDI regimen . Attempted to prescribe Rybelsus but not covered by insurance.      Today (05/18/2020): Erica Buchanan is here for a follow up on diabetes.  She checks her blood sugars 2 times daily, preprandial to breakfast and dinner. The patient has not had hypoglycemic episodes since the last clinic visit.     Denies nausea or vomiting.   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 Diabetes mellitus without complication  ([place])    Type 1    Past Surgical History: No past surgical history on file.  Social History:  reports that she has never smoked. She has never used smokeless tobacco. She reports current alcohol use. She reports that she does not use drugs. Family History:  Family History  Problem Relation Age of Onset  . Healthy Mother   . Diabetes Father      HOME MEDICATIONS: Allergies as of 05/18/2020   No Known Allergies     Medication List       Accurate as of May 18, 2020  8:24 AM. If you have any questions, ask your nurse or doctor.        Contour Next Test test strip Generic drug: glucose blood 4x daily   Dexcom G6 Sensor Misc 1 Device by Does not apply route as directed.   Dexcom G6 Transmitter Misc 1 Device by Does not apply route as directed.   Glucagon Emergency 1 MG Kit Inject 1 mg as directed as needed (Hypoglycemia with altered level of consciousness, then call 911).   insulin lispro 100 UNIT/ML KwikPen Commonly known as: HumaLOG KwikPen Inject 0.1 mLs (10 Units total) into the skin 3 (three) times daily. Max daily dose of 55 units What changed: how much to take   Lantus SoloStar 100 UNIT/ML Solostar Pen Generic drug: insulin glargine Inject 28 Units into the skin daily.   MULTIVITAMIN ADULT PO Take 1 tablet by mouth  daily.   Ozempic (0.25 or 0.5 MG/DOSE) 2 MG/1.5ML Sopn Generic drug: Semaglutide(0.25 or 0.5MG /DOS) Inject 0.25 mg into the skin once a week.        OBJECTIVE:   Vital Signs: BP 130/82   Pulse 91   Ht 5\' 6"  (1.676 m)   Wt 200 lb 4 oz (90.8 kg)   SpO2 98%   BMI 32.32 kg/m   Wt Readings from Last 3 Encounters:  05/18/20 200 lb 4 oz (90.8 kg)  12/23/19 198 lb 3.2 oz (89.9 kg)  08/12/19 191 lb 3.2 oz (86.7 kg)     Exam: General: Pt appears well and is in NAD  Neck: General: Supple without adenopathy. Thyroid: Thyroid size normal.  No goiter or nodules appreciated. No thyroid bruit.  Lungs: Clear with good BS bilat with no  rales, rhonchi, or wheezes  Heart: RRR with normal S1 and S2 and no gallops; no murmurs; no rub  Abdomen: Normoactive bowel sounds, soft, nontender, without masses or organomegaly palpable  Extremities: No pretibial edema.   Neuro: MS is good with appropriate affect, pt is alert and Ox3    DM foot exam: 05/13/2019  The skin of the feet is intact without sores or ulcerations. The pedal pulses are 2+ on right and 2+ on left. The sensation is intact to a screening 5.07, 10 gram monofilament bilaterally     DATA REVIEWED:  Lab Results  Component Value Date   HGBA1C 7.6 (A) 05/18/2020   HGBA1C 8.1 (A) 12/23/2019   HGBA1C 8.4 (A) 08/12/2019   Lab Results  Component Value Date   CREATININE 0.78 04/21/2019     ASSESSMENT / PLAN / RECOMMENDATIONS:   1 ) Type 1 Diabetes Mellitus, Sub-Optimally controlled with improving glycemic control, complicated by insulin resistance  (double diabetes) - Most recent A1c of 7.6 %. Goal A1c < 7.0 %.   - A1c continues to improve  - Declined an insulin  pump in the past  -Dexcom - cost prohibitive  - Intolerant to Metformin  - Rybelsus not covered, will try Ozempic  - Continues to use lower doses of prandial insulin then previously prescribed      MEDICATIONS: -Continue Lantus 28 units daily  -Increase Humalog to 10 units with each meal  - CF : Humalog ( BG - 130/35) - Start Ozempic 0.25 mg weekly, then increase to 0.5 mg weekly   EDUCATION / INSTRUCTIONS:  BG monitoring instructions: Patient is instructed to check her blood sugars 3 times a day, before meals and bedtime   Call [facility] if: BG persistently < 70 . Marland Kitchen I reviewed the Rule of 15 for the treatment of hypoglycemia in detail with the patient. Literature supplied.    2) Diabetic complications:   Eye: Does have known diabetic retinopathy.   Neuro/ Feet: Does not have known diabetic peripheral neuropathy.  Renal: Patient does not have known baseline CKD.  She is not on an ACEI/ARB at present.  F/U in 3 months    Signed electronically by: Mack Guise, MD  [facility] Endocrinology  [facility] [address] [place], [place] [address] Phone: [phone] FAX: [phone]   CC: Johna Roles, [place] [address] [place] [address] Phone: [phone]  Fax: [phone]  Return to Endocrinology clinic as below: No future appointments.

## 2020-08-13 ENCOUNTER — Other Ambulatory Visit: Payer: Self-pay | Admitting: Internal Medicine

## 2020-09-21 ENCOUNTER — Ambulatory Visit: Payer: Managed Care, Other (non HMO) | Admitting: Internal Medicine

## 2020-10-26 ENCOUNTER — Other Ambulatory Visit: Payer: Self-pay | Admitting: Internal Medicine

## 2020-12-23 ENCOUNTER — Telehealth: Payer: Self-pay | Admitting: Pharmacy Technician

## 2020-12-23 NOTE — Telephone Encounter (Addendum)
Patient Advocate Encounter   Received notification from [employer] that prior authorization for [facility] 0.5MG  WEEKLY is required.   PA submitted on 12/23/2020 Key [id number] Status is APPROVED  12/23/2020 - 12/23/2021    [facility] will continue to follow   Jeannette How, CPhT Patient Advocate [facility] Phone: [phone] Fax:  [phone]

## 2021-03-17 ENCOUNTER — Other Ambulatory Visit: Payer: Self-pay | Admitting: Internal Medicine

## 2021-03-24 ENCOUNTER — Emergency Department: Payer: Managed Care, Other (non HMO)

## 2021-03-24 ENCOUNTER — Emergency Department
Admission: EM | Admit: 2021-03-24 | Discharge: 2021-03-24 | Disposition: A | Discharge status: Home / Self Care | Payer: Managed Care, Other (non HMO) | Attending: Student in an Organized Health Care Education/Training Program | Admitting: Student in an Organized Health Care Education/Training Program

## 2021-03-24 ENCOUNTER — Other Ambulatory Visit: Payer: Self-pay

## 2021-03-24 DIAGNOSIS — N390 Urinary tract infection, site not specified: Secondary | ICD-10-CM

## 2021-03-24 DIAGNOSIS — R791 Abnormal coagulation profile: Secondary | ICD-10-CM | POA: Insufficient documentation

## 2021-03-24 DIAGNOSIS — R0789 Other chest pain: Secondary | ICD-10-CM | POA: Insufficient documentation

## 2021-03-24 DIAGNOSIS — R519 Headache, unspecified: Secondary | ICD-10-CM | POA: Diagnosis present

## 2021-03-24 DIAGNOSIS — I10 Essential (primary) hypertension: Secondary | ICD-10-CM | POA: Insufficient documentation

## 2021-03-24 DIAGNOSIS — R000 Tachycardia, unspecified: Secondary | ICD-10-CM | POA: Insufficient documentation

## 2021-03-24 DIAGNOSIS — Z794 Long term (current) use of insulin: Secondary | ICD-10-CM | POA: Insufficient documentation

## 2021-03-24 DIAGNOSIS — U071 COVID-19: Secondary | ICD-10-CM | POA: Diagnosis not present

## 2021-03-24 DIAGNOSIS — E103293 Type 1 diabetes mellitus with mild nonproliferative diabetic retinopathy without macular edema, bilateral: Secondary | ICD-10-CM | POA: Diagnosis not present

## 2021-03-24 HISTORY — DX: Essential (primary) hypertension: I10

## 2021-03-24 LAB — BASIC METABOLIC PANEL
Anion gap: 8 (ref 5–15)
BUN: 10 mg/dL (ref 6–20)
CO2: 22 mmol/L (ref 22–32)
Calcium: 9.1 mg/dL (ref 8.9–10.3)
Chloride: 103 mmol/L (ref 98–111)
Creatinine, Ser: 0.78 mg/dL (ref 0.44–1.00)
GFR, Estimated: >60 mL/min (ref >60)
Glucose, Bld: 230 mg/dL — ABNORMAL HIGH (ref 70–99)
Potassium: 3.6 mmol/L (ref 3.5–5.1)
Sodium: 133 mmol/L — ABNORMAL LOW (ref 135–145)

## 2021-03-24 LAB — URINALYSIS, MICROSCOPIC (REFLEX)

## 2021-03-24 LAB — CBC
HCT: 36.3 % (ref 36.0–46.0)
Hemoglobin: 12.5 g/dL (ref 12.0–15.0)
MCH: 28.7 pg (ref 26.0–34.0)
MCHC: 34.4 g/dL (ref 30.0–36.0)
MCV: 83.4 fL (ref 80.0–100.0)
Platelets: 325 10*3/uL (ref 150–400)
RBC: 4.35 MIL/uL (ref 3.87–5.11)
RDW: 12.2 % (ref 11.5–15.5)
WBC: 7.1 10*3/uL (ref 4.0–10.5)
nRBC: 0 % (ref 0.0–0.2)

## 2021-03-24 LAB — URINALYSIS, ROUTINE W REFLEX MICROSCOPIC
Bilirubin Urine: NEGATIVE
Glucose, UA: 250 mg/dL — AB
Ketones, ur: 40 mg/dL — AB
Nitrite: NEGATIVE
Protein, ur: 100 mg/dL — AB
Specific Gravity, Urine: 1.025 (ref 1.005–1.030)
pH: 5 (ref 5.0–8.0)

## 2021-03-24 LAB — TROPONIN I (HIGH SENSITIVITY)
Troponin I (High Sensitivity): 5 ng/L (ref <18)
Troponin I (High Sensitivity): 4 ng/L (ref <18)

## 2021-03-24 LAB — RESP PANEL BY RT-PCR (FLU A&B, COVID) ARPGX2
Influenza A by PCR: NEGATIVE
Influenza B by PCR: NEGATIVE
SARS Coronavirus 2 by RT PCR: POSITIVE — AB

## 2021-03-24 LAB — PREGNANCY, URINE: Preg Test, Ur: NEGATIVE

## 2021-03-24 LAB — D-DIMER, QUANTITATIVE: D-Dimer, Quant: 1.4 ug/mL-FEU — ABNORMAL HIGH (ref 0.00–0.50)

## 2021-03-24 MED ORDER — IOHEXOL 350 MG/ML SOLN
100.0000 mL | Freq: Once | INTRAVENOUS | Status: AC | PRN
  Administered 2021-03-24: 19:00:00 100 mL via INTRAVENOUS
  Filled 2021-03-24: qty 100

## 2021-03-24 MED ORDER — SODIUM CHLORIDE 0.9 % IV BOLUS
500.0000 mL | Freq: Once | INTRAVENOUS | Status: AC
  Administered 2021-03-24: 20:00:00 500 mL via INTRAVENOUS

## 2021-03-24 MED ORDER — METOCLOPRAMIDE HCL 10 MG PO TABS
10.0000 mg | ORAL_TABLET | Freq: Once | ORAL | Status: AC
  Administered 2021-03-24: 18:00:00 10 mg via ORAL
  Filled 2021-03-24: qty 1

## 2021-03-24 MED ORDER — LORAZEPAM 0.5 MG PO TABS
0.5000 mg | ORAL_TABLET | Freq: Once | ORAL | Status: AC
  Administered 2021-03-24: 18:00:00 0.5 mg via ORAL
  Filled 2021-03-24: qty 1

## 2021-03-24 MED ORDER — SODIUM CHLORIDE 0.9 % IV BOLUS
500.0000 mL | Freq: Once | INTRAVENOUS | Status: AC
  Administered 2021-03-24: 18:00:00 500 mL via INTRAVENOUS

## 2021-03-24 MED ORDER — SODIUM CHLORIDE 0.9 % IV BOLUS: 1000.0000 mL | Freq: Once | INTRAVENOUS | Status: DC

## 2021-03-24 MED ORDER — FLUCONAZOLE 100 MG PO TABS: ORAL_TABLET | ORAL | 0 refills | Status: DC

## 2021-03-24 MED ORDER — PROCHLORPERAZINE EDISYLATE 10 MG/2ML IJ SOLN
10.0000 mg | Freq: Once | INTRAMUSCULAR | Status: AC
  Administered 2021-03-24: 20:00:00 10 mg via INTRAVENOUS
  Filled 2021-03-24: qty 2

## 2021-03-24 MED ORDER — ACETAMINOPHEN 325 MG PO TABS
650.0000 mg | ORAL_TABLET | Freq: Once | ORAL | Status: AC
  Administered 2021-03-24: 18:00:00 650 mg via ORAL
  Filled 2021-03-24: qty 2

## 2021-03-24 MED ORDER — DIPHENHYDRAMINE HCL 50 MG/ML IJ SOLN
12.5000 mg | Freq: Once | INTRAMUSCULAR | Status: AC
  Administered 2021-03-24: 20:00:00 12.5 mg via INTRAVENOUS
  Filled 2021-03-24: qty 1

## 2021-03-24 MED ORDER — CEPHALEXIN 500 MG PO CAPS: 500.0000 mg | ORAL_CAPSULE | Freq: Three times a day (TID) | ORAL | 0 refills | Status: AC

## 2021-03-24 MED ORDER — NIRMATRELVIR/RITONAVIR (PAXLOVID)TABLET: 3.0000 | ORAL_TABLET | Freq: Two times a day (BID) | ORAL | 0 refills | Status: AC

## 2021-03-24 MED ORDER — CEPHALEXIN 500 MG PO CAPS
500.0000 mg | ORAL_CAPSULE | Freq: Once | ORAL | Status: AC
  Administered 2021-03-24: 23:00:00 500 mg via ORAL
  Filled 2021-03-24: qty 1

## 2021-03-24 MED ORDER — NIRMATRELVIR/RITONAVIR (PAXLOVID)TABLET: 3.0000 | ORAL_TABLET | Freq: Two times a day (BID) | ORAL | Status: DC

## 2021-03-24 NOTE — ED Notes (Signed)
Pt requesting to not move or have blood work done at this moment due to feeling nauseated with her migraine

## 2021-03-24 NOTE — ED Notes (Signed)
Pt states that she still has a little bit of a headache, and reports that her heart rate cont to be elevated at 125

## 2021-03-24 NOTE — ED Provider Notes (Addendum)
[facility] Emergency Department Provider Note    Event Date/Time   First MD Initiated Contact with Patient 03/24/21 1702     (approximate)  I have reviewed the triage vital signs and the nursing notes.   HISTORY  Chief Complaint Dizziness and Hypertension    HPI Erica Buchanan is a 30 y.o. female with a history of diabetes presents to the ER for evaluation of some chest discomfort or palpitations headache symptoms started at work today.  Denies any cough or congestion.  Does have some mild discomfort when taking deep inspiration.  No nausea or vomiting no abdominal pain.  History of retinal detachment denies any new visual disturbance.  She denies any injuries.  No history of blood clot on her anticoagulation.  Past Medical History:  Diagnosis Date   Diabetes mellitus without complication ([place])    Type 1   Hypertension    Family History  Problem Relation Age of Onset   Healthy Mother    Diabetes Father    History reviewed. No pertinent surgical history. 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 Authorizing Provider  Continuous Blood Gluc Sensor (DEXCOM G6 SENSOR) MISC 1 Device by Does not apply route as directed. Patient not taking: Reported on 05/18/2020 12/23/19   Shamleffer, Melanie Crazier, MD  Continuous Blood Gluc Transmit (DEXCOM G6 TRANSMITTER) MISC 1 Device by Does not apply route as directed. Patient not taking: Reported on 05/18/2020 12/23/19   Shamleffer, Melanie Crazier, MD  Glucagon, rDNA, (GLUCAGON EMERGENCY) 1 MG KIT Inject 1 mg as directed as needed (Hypoglycemia with altered level of consciousness, then call 911). 04/21/19   Lorin Glass, PA-C   glucose blood (CONTOUR NEXT TEST) test strip 4x daily 12/23/19   Shamleffer, Melanie Crazier, MD  insulin lispro (HUMALOG) 100 UNIT/ML KwikPen INJECT 10 UNITS INTO THE SKIN THREE TIMES DAILY. MAX DAILY DOSE OF 55 UNITS 10/27/20   Shamleffer, Melanie Crazier, MD  Insulin Pen Needle 32G X 4 MM MISC 1 Device by Does not apply route in the morning, at noon, in the evening, and at bedtime. 05/18/20   Shamleffer, Melanie Crazier, MD  LANTUS SOLOSTAR 100 UNIT/ML Solostar Pen ADMINISTER 28 UNITS UNDER THE SKIN DAILY 03/17/21   Shamleffer, Melanie Crazier, MD  Multiple Vitamin (MULTIVITAMIN ADULT PO) Take 1 tablet by mouth daily.    [provider]  Semaglutide,0.25 or 0.5MG/DOS, (OZEMPIC, 0.25 OR 0.5 MG/DOSE,) 2 MG/1.5ML SOPN Inject 0.5 mg into the skin once a week. 05/18/20   Shamleffer, Melanie Crazier, MD    Allergies Patient has no known allergies.    Social History Social History   Tobacco Use   Smoking status: Never   Smokeless tobacco: Never  Substance Use Topics   Alcohol use: Yes   Drug use: Never    Review of Systems Patient denies headaches, rhinorrhea, blurry vision, numbness, shortness of breath, chest pain, edema, cough, abdominal pain, nausea, vomiting, diarrhea, dysuria, fevers, rashes or hallucinations unless otherwise stated above in HPI. ____________________________________________   PHYSICAL EXAM:  VITAL SIGNS: Vitals:   03/24/21 1512 03/24/21 1800  BP: (!) 182/90 (!) 149/86  Pulse: (!) 133 (!) 123  Resp: 20   Temp: 98.7 F (37.1 C)  SpO2: 98% 100%    Constitutional: Alert and oriented.  Eyes: Conjunctivae are normal.  Head: Atraumatic. Nose: No congestion/rhinnorhea. Mouth/Throat: Mucous membranes are moist.   Neck: No stridor. Painless ROM.  Cardiovascular: Normal rate, regular rhythm. Grossly normal heart sounds.  Good peripheral circulation. Respiratory: Normal respiratory effort.  No retractions. Lungs CTAB. Gastrointestinal: Soft and  nontender. No distention. No abdominal bruits. No CVA tenderness. Genitourinary:  Musculoskeletal: No lower extremity tenderness nor edema.  No joint effusions. Neurologic:  Normal speech and language. No gross focal neurologic deficits are appreciated. No facial droop Skin:  Skin is warm, dry and intact. No rash noted. Psychiatric: Mood and affect are normal. Speech and behavior are normal.  ____________________________________________   LABS (all labs ordered are listed, but only abnormal results are displayed)  Results for orders placed or performed during the hospital encounter of 03/24/21 (from the past 24 hour(s))  Basic metabolic panel     Status: Abnormal   Collection Time: 03/24/21  3:16 PM  Result Value Ref Range   Sodium 133 (L) 135 - 145 mmol/L   Potassium 3.6 3.5 - 5.1 mmol/L   Chloride 103 98 - 111 mmol/L   CO2 22 22 - 32 mmol/L   Glucose, Bld 230 (H) 70 - 99 mg/dL   BUN 10 6 - 20 mg/dL   Creatinine, Ser 0.78 0.44 - 1.00 mg/dL   Calcium 9.1 8.9 - 10.3 mg/dL   GFR, Estimated >60 >60 mL/min   Anion gap 8 5 - 15  CBC     Status: None   Collection Time: 03/24/21  3:16 PM  Result Value Ref Range   WBC 7.1 4.0 - 10.5 K/uL   RBC 4.35 3.87 - 5.11 MIL/uL   Hemoglobin 12.5 12.0 - 15.0 g/dL   HCT 36.3 36.0 - 46.0 %   MCV 83.4 80.0 - 100.0 fL   MCH 28.7 26.0 - 34.0 pg   MCHC 34.4 30.0 - 36.0 g/dL   RDW 12.2 11.5 - 15.5 %   Platelets 325 150 - 400 K/uL   nRBC 0.0 0.0 - 0.2 %  Troponin I (High Sensitivity)     Status: None   Collection Time: 03/24/21  3:16 PM  Result Value Ref Range   Troponin I (High Sensitivity) 5 <18 ng/L  D-dimer, quantitative     Status: Abnormal   Collection Time: 03/24/21  6:20 PM  Result Value Ref Range   D-Dimer, Quant 1.40 (H) 0.00 - 0.50 ug/mL-FEU   ____________________________________________  EKG My review and personal interpretation at Time: 15:19   Indication: htn  Rate: 130  Rhythm: sinus Axis: normal Other: normal intervals, no  stemi, no depression ____________________________________________  RADIOLOGY  I personally reviewed all radiographic images ordered to evaluate for the above acute complaints and reviewed radiology reports and findings.  These findings were personally discussed with the patient.  Please see medical record for radiology report.  ____________________________________________   PROCEDURES  Procedure(s) performed:  Procedures    Critical Care performed: no ____________________________________________   INITIAL IMPRESSION / ASSESSMENT AND PLAN / ED COURSE  Pertinent labs & imaging results that were available during my care of the patient were reviewed by me and considered in my medical decision making (see chart for details).   DDX: dehydration, dysrhythmia, acs, pe, uri, covid, influenza  Erica Buchanan is a 30 y.o. who presents to the ED with symptoms as described above.  Patient clinically nontoxic-appearing but is tachycardic.  EKG shows tachycardia no sign  of ischemia.  States that she has been evaluated for thyroid disease and its all been normal.  Blood will be sent for the above differential will provide migraine cocktail she says that she has a history of migraines and this feels similar.  Clinical Course as of 03/24/21 1942  Wed Mar 24, 2021  1926 D-dimer is elevated given her tachycardia will order CTA.  Assessing for something else for her headache.  She has no neurodeficits at all fillets her CT imaging of her brain is clinically indicated.  We will add on COVID as well as flu.  She states that she has a history of migraines and this feels similar will give additional migraine cocktail.  Patient be signed out to oncoming physician pending follow-up CTA reassessment. [PR]    Clinical Course User Index [PR] Merlyn Lot, MD    The patient was evaluated in Emergency Department today for the symptoms described in the history of present illness. He/she was  evaluated in the context of the global COVID-19 pandemic, which necessitated consideration that the patient might be at risk for infection with the SARS-CoV-2 virus that causes COVID-19. Institutional protocols and algorithms that pertain to the evaluation of patients at risk for COVID-19 are in a state of rapid change based on information released by regulatory bodies including the CDC and federal and state organizations. These policies and algorithms were followed during the patient's care in the ED.  As part of my medical decision making, I reviewed the following data within the [place] notes reviewed and incorporated, Labs reviewed, notes from prior ED visits and [place] Controlled Substance Database   ____________________________________________   FINAL CLINICAL IMPRESSION(S) / ED DIAGNOSES  Final diagnoses:  Hypertension, unspecified type  Tachycardia  Atypical chest pain      NEW MEDICATIONS STARTED DURING THIS VISIT:  New Prescriptions   No medications on file     Note:  This document was prepared using Dragon voice recognition software and may include unintentional dictation errors.    Merlyn Lot, MD 03/24/21 [phone]

## 2021-03-24 NOTE — ED Triage Notes (Signed)
Pt comes into the ED via EMS from work at [employer], with c/o HA and dizziness today,   200/87 132HR 100%RA [id number]

## 2021-03-24 NOTE — ED Provider Notes (Signed)
Patient's heart rate has come down.  It is now about 90.  Her CT angio was negative.  Patient does have some red and white blood cells in her urine and rare bacteria.  Additionally her COVID test is positive.  That likely explains most of her symptoms.  Since she is diabetic I will treat her with pack Slo-Bid.  Additionally I will give her Keflex for her apparent UTI.  She has no chest pain or belly pain or any other symptoms currently.  Her headache is better.  We will let her go home warned her to follow-up with her doctor or return here if she has high fever or shortness of breath or feel sicker at all.  She will also have to keep a close eye on her blood sugar.   Arnaldo Natal, MD 03/24/21 2256

## 2021-03-24 NOTE — Discharge Instructions (Addendum)
Your COVID test was positive.  This is probably what is causing most of your symptoms.  I have given you a prescription for paxlovid.  I cannot give you any tonight but please go to the pharmacy and try to get some tomorrow.  Your urine also looks like you may have a UTI.  I have given you prescription for some Keflex 1 pill 3 times a day for that.  Please make sure you are drinking plenty of fluid.  Please use Tylenol or Advil if need be for fever.  Have your doctor keep an eye on you.  I would follow-up with your doctor within the week.  Please return here if you get higher fever or shortness of breath or any other problems or return of chest pain or rapid heartbeat.  Please keep a close eye on your blood sugar as well.  If you get a fungal infection take 1 Diflucan at the end of the antibiotics.

## 2021-05-13 ENCOUNTER — Other Ambulatory Visit: Payer: Self-pay | Admitting: Internal Medicine

## 2021-12-01 ENCOUNTER — Other Ambulatory Visit (HOSPITAL_COMMUNITY): Payer: Self-pay

## 2021-12-02 ENCOUNTER — Telehealth: Payer: Self-pay | Admitting: Pharmacy Technician

## 2021-12-02 NOTE — Telephone Encounter (Signed)
Received letter from [employer] that the PA request for Ozempic on 12/01/21 was cancelled, because the PA that was approved last year on 12/23/20 is good through 12/23/22, instead of 12/22/21. Saved letter to [employer] for reference.

## 2022-02-13 IMAGING — DX DG CHEST 1V PORT
1 series · 1 of 1 positions shown · non-contrast
Comparison: 04/21/2019

CLINICAL DATA: Chest pain, initial encounter

EXAM:
PORTABLE CHEST 1 VIEW

[chest ap]
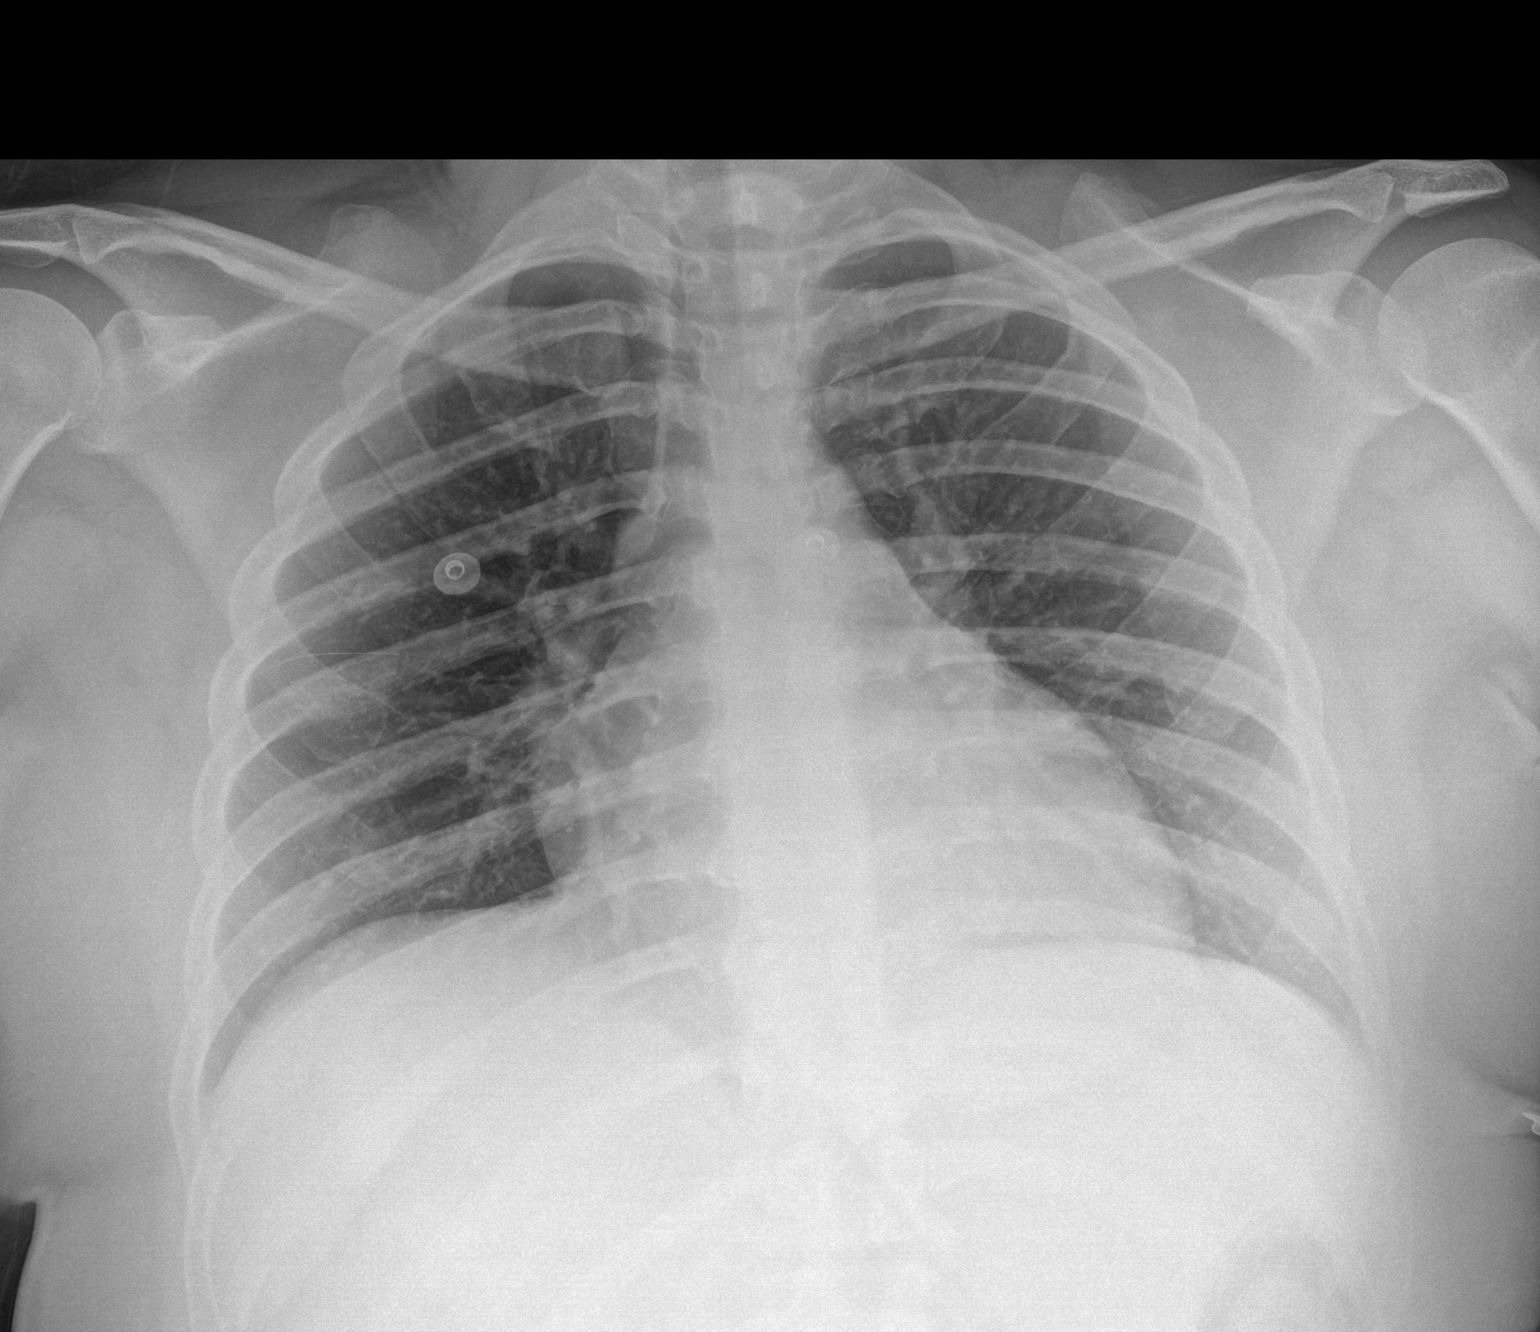

[1 of 1 positions shown; findings below may reference images not displayed]

FINDINGS: The heart size and mediastinal contours are within normal limits.
Both lungs are clear. The visualized skeletal structures are
unremarkable.
IMPRESSION: No active disease.

## 2022-02-13 IMAGING — CT CT ANGIO CHEST
2 of 7 series · 18 of 46 positions shown · IV contrast (APPLIED)
Comparison: None.

CLINICAL DATA: Headache and dizziness.

EXAM:
CT ANGIOGRAPHY CHEST WITH CONTRAST
TECHNIQUE: Multidetector CT imaging of the chest was performed using the
standard protocol during bolus administration of intravenous
contrast. Multiplanar CT image reconstructions and MIPs were
obtained to evaluate the vascular anatomy.
CONTRAST:  100mL OMNIPAQUE IOHEXOL 350 MG/ML SOLN

[Series 5: thins · axial · 0.74mm/px · z∈[-557,-343]mm · 15 of 298 slices shown]
[im 15/298  lung]
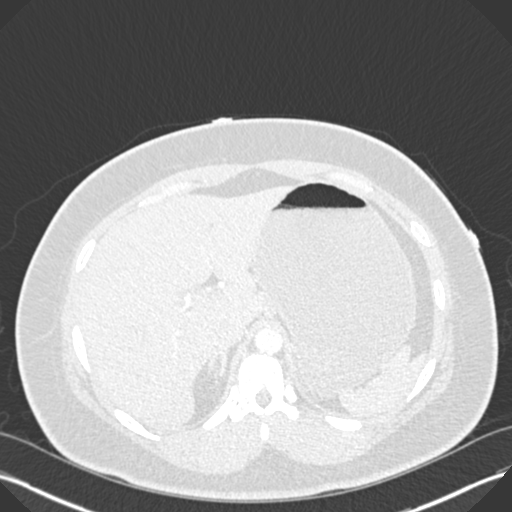
[im 30/298  soft-tissue]
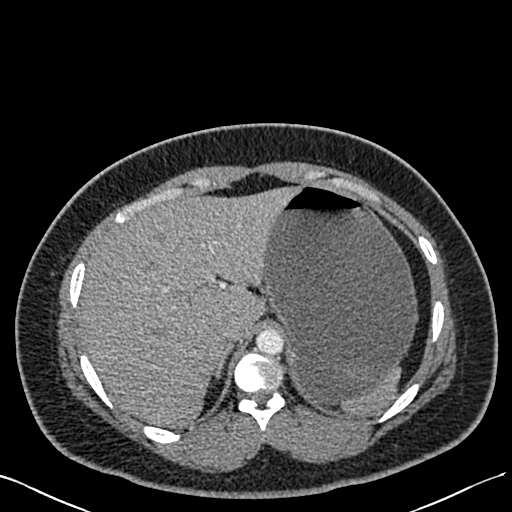
[im 60/298  lung]
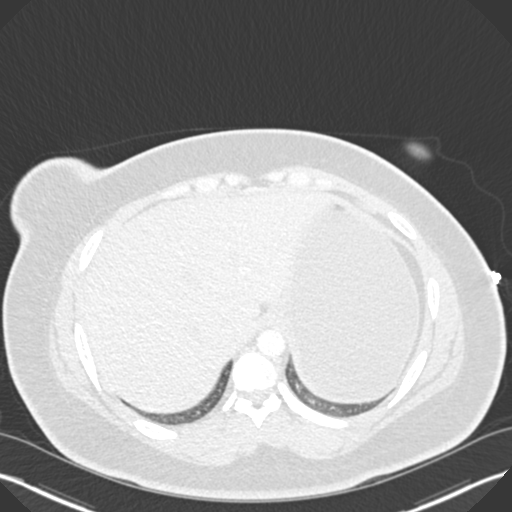
[im 75/298  soft-tissue]
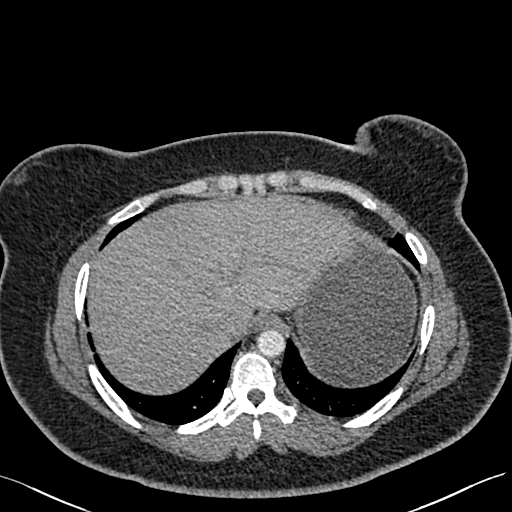
[im 90/298  lung]
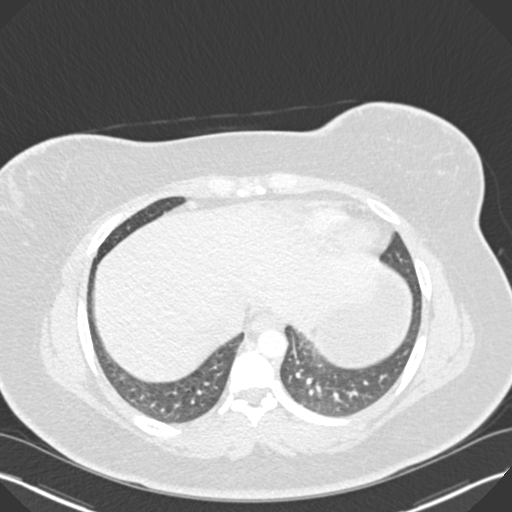
[im 104/298  soft-tissue]
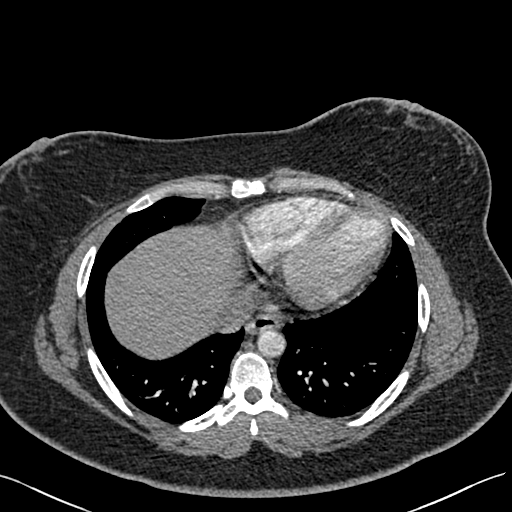
[im 134/298  lung]
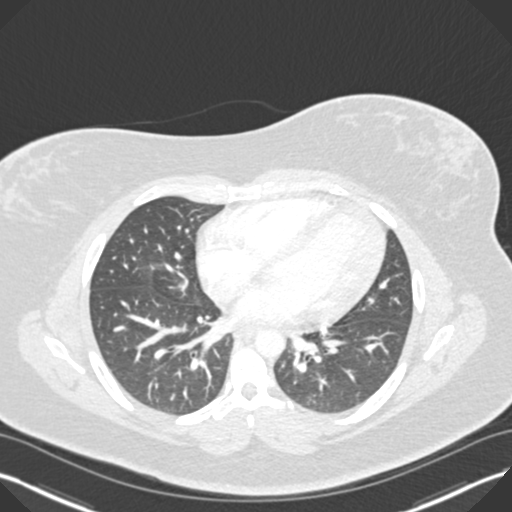
[im 149/298  soft-tissue]
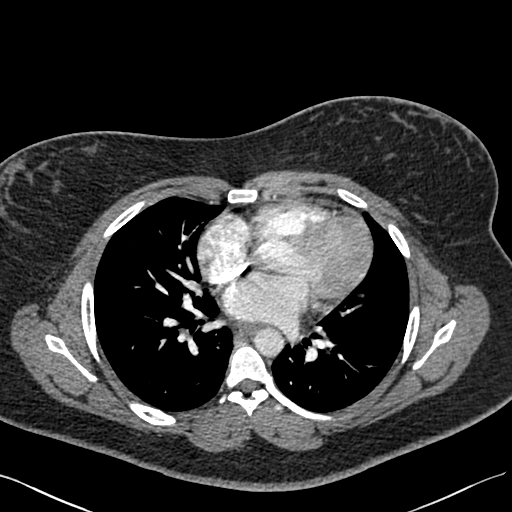
[im 164/298  lung]
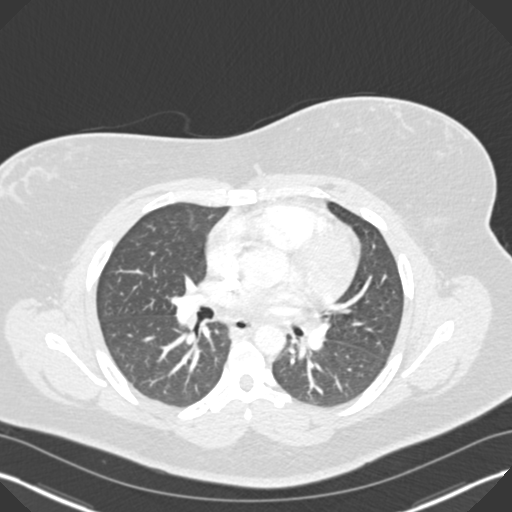
[im 194/298  soft-tissue]
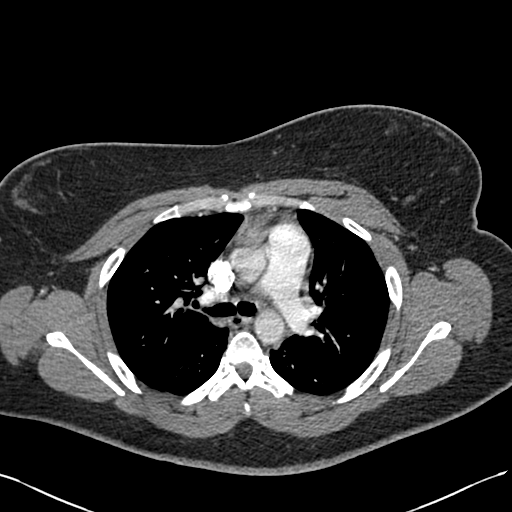
[im 208/298  lung]
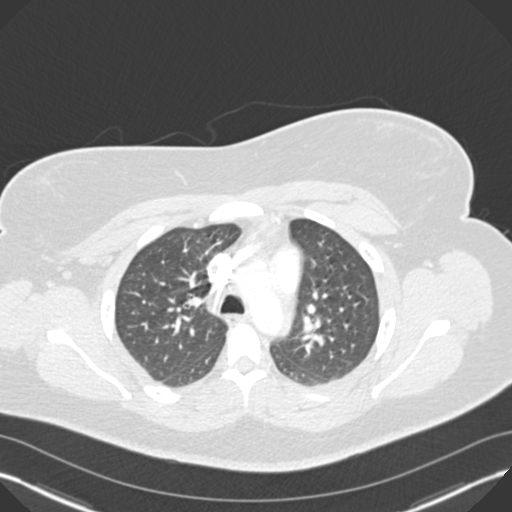
[im 223/298  soft-tissue]
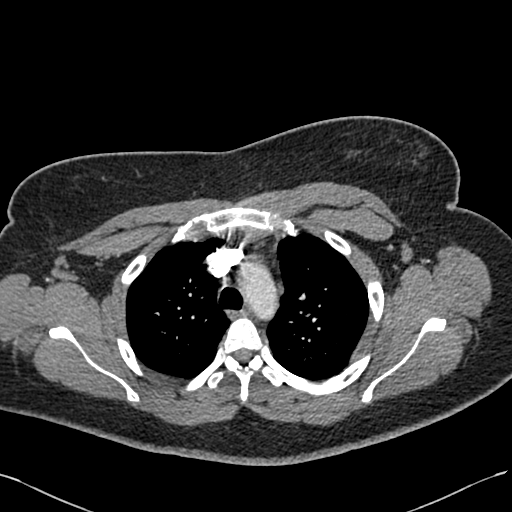
[im 238/298  lung]
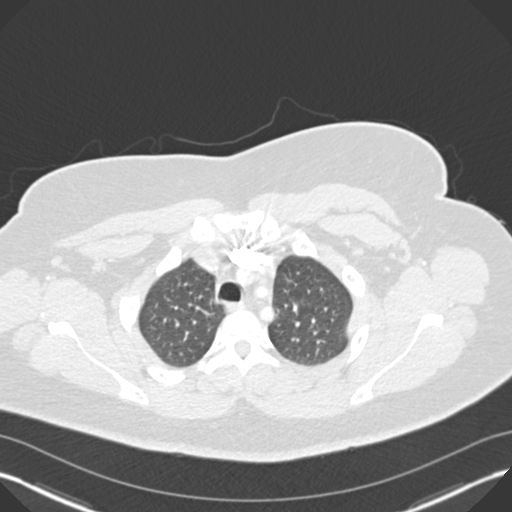
[im 268/298  soft-tissue]
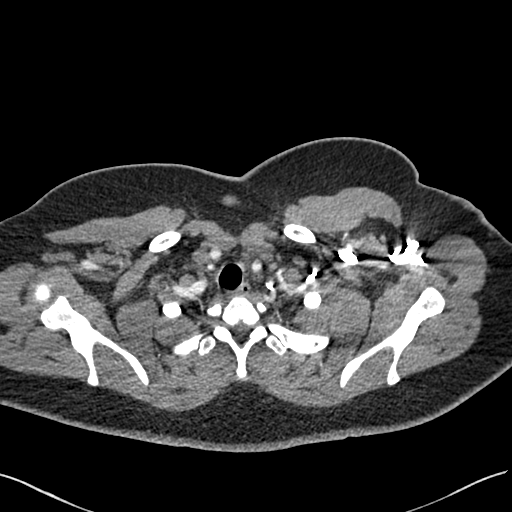
[im 283/298  lung]
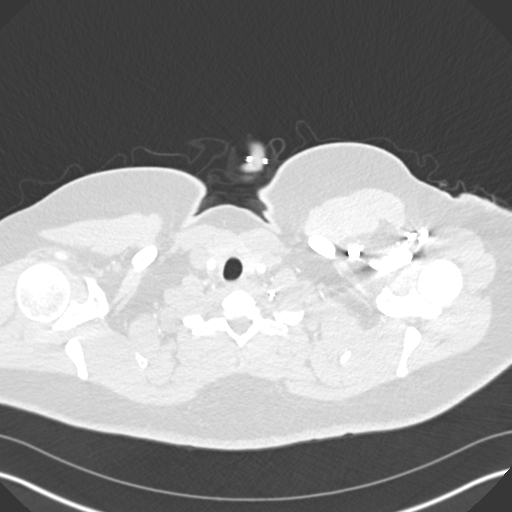

[Series 7: coronal mpr · coronal · 0.49mm/px · 3 of 83 slices shown]
[im 21/83  soft-tissue]
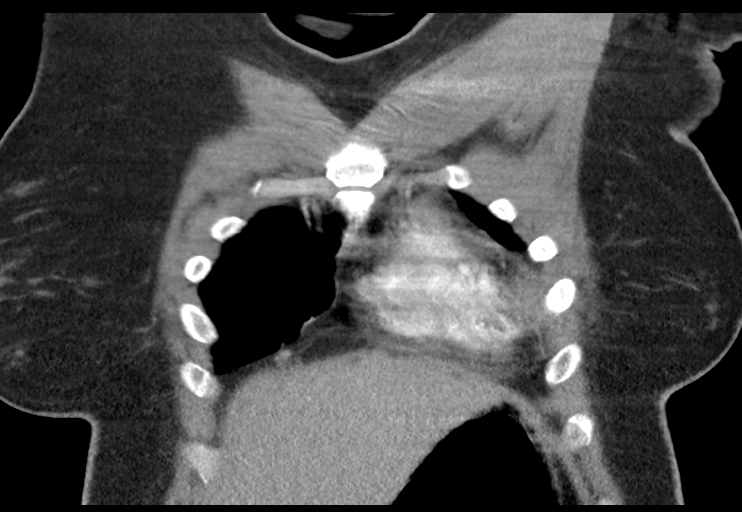
[im 42/83  soft-tissue]
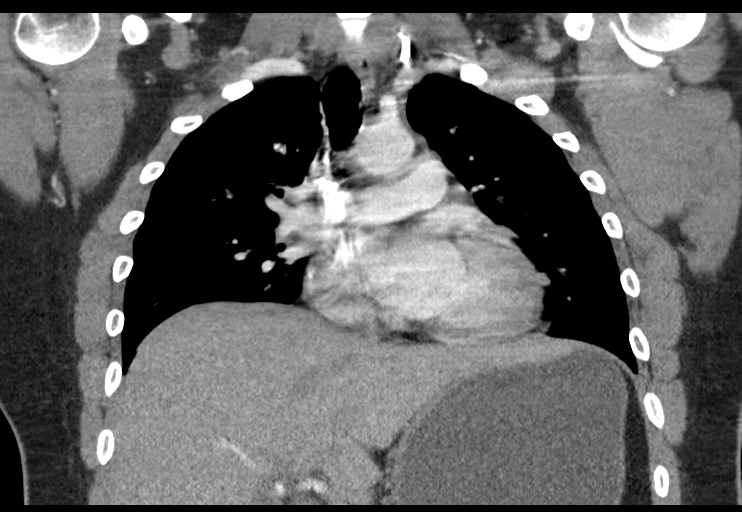
[im 62/83  soft-tissue]
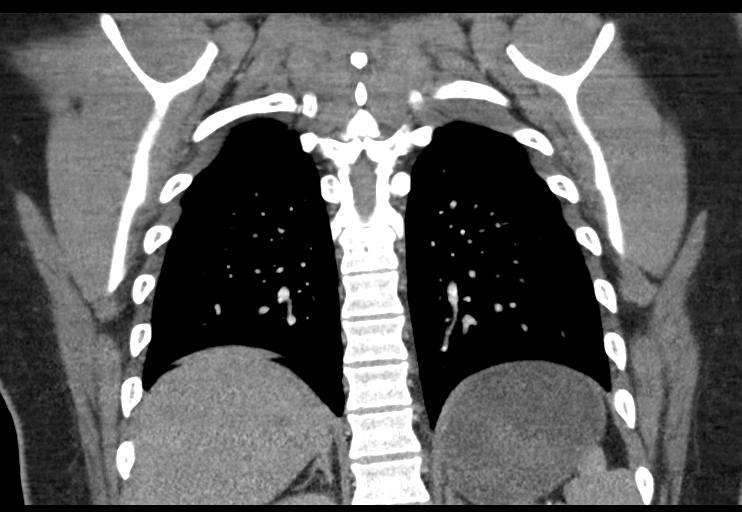

[18 of 46 positions shown; findings below may reference images not displayed]

FINDINGS: Cardiovascular: Satisfactory opacification of the pulmonary arteries
to the segmental level. No evidence of pulmonary embolism. Normal
heart size. No pericardial effusion.

Mediastinum/Nodes: No enlarged mediastinal, hilar, or axillary lymph
nodes. Thyroid gland, trachea, and esophagus demonstrate no
significant findings.

Lungs/Pleura: Lungs are clear. No pleural effusion or pneumothorax.

Upper Abdomen: No acute abnormality.

Musculoskeletal: No chest wall abnormality. No acute or significant
osseous findings.

Review of the MIP images confirms the above findings.
IMPRESSION: Negative examination for pulmonary embolism or acute cardiopulmonary
disease.

## 2022-03-15 ENCOUNTER — Ambulatory Visit (INDEPENDENT_AMBULATORY_CARE_PROVIDER_SITE_OTHER): Payer: Managed Care, Other (non HMO) | Admitting: Internal Medicine

## 2022-03-15 ENCOUNTER — Encounter: Payer: Self-pay | Admitting: Internal Medicine

## 2022-03-15 VITALS — BP 124/72 | HR 80 | Ht 67.0 in | Wt 220.0 lb

## 2022-03-15 DIAGNOSIS — E103293 Type 1 diabetes mellitus with mild nonproliferative diabetic retinopathy without macular edema, bilateral: Secondary | ICD-10-CM | POA: Diagnosis not present

## 2022-03-15 DIAGNOSIS — E1065 Type 1 diabetes mellitus with hyperglycemia: Secondary | ICD-10-CM

## 2022-03-15 LAB — POCT GLYCOSYLATED HEMOGLOBIN (HGB A1C): Hemoglobin A1C: 9.3 % — AB (ref 4.0–5.6)

## 2022-03-15 MED ORDER — LANTUS SOLOSTAR 100 UNIT/ML ~~LOC~~ SOPN: 28.0000 [IU] | PEN_INJECTOR | Freq: Every day | SUBCUTANEOUS | 3 refills | Status: AC

## 2022-03-15 MED ORDER — OZEMPIC (0.25 OR 0.5 MG/DOSE) 2 MG/1.5ML ~~LOC~~ SOPN: 0.2500 mg | PEN_INJECTOR | SUBCUTANEOUS | 3 refills | Status: AC

## 2022-03-15 MED ORDER — HUMALOG KWIKPEN 200 UNIT/ML ~~LOC~~ SOPN: PEN_INJECTOR | SUBCUTANEOUS | 2 refills | Status: DC

## 2022-03-15 MED ORDER — LANTUS SOLOSTAR 100 UNIT/ML ~~LOC~~ SOPN: 28.0000 [IU] | PEN_INJECTOR | Freq: Every day | SUBCUTANEOUS | 3 refills | Status: DC

## 2022-03-15 MED ORDER — DEXCOM G7 SENSOR MISC: 1.0000 | 3 refills | Status: AC

## 2022-03-15 MED ORDER — INSULIN PEN NEEDLE 32G X 4 MM MISC: 1.0000 | Freq: Four times a day (QID) | 3 refills | Status: AC

## 2022-03-15 NOTE — Patient Instructions (Addendum)
-   Continue Ozempic 0.25 mg ONCE Weekly - Continue Lantus 28 units daily  - Increase Humalog 12 units with each meal but decrease  - Humalog correctional insulin: ADD extra units on insulin to your meal-time humalog dose if your blood sugars are higher than  Use the scale below to help guide you:   Blood sugar before meal Number of units to inject  Less than 165 0 unit  166 -  200 1 units  201 -  235 2 units  236 -  270 3 units  271 -  305 4 units  306 -  340 5 units  341 -  375 6 units  376-  410 7 units     HOW TO TREAT LOW BLOOD SUGARS (Blood sugar LESS THAN 70 MG/DL) Please follow the RULE OF 15 for the treatment of hypoglycemia treatment (when your (blood sugars are less than 70 mg/dL)   STEP 1: Take 15 grams of carbohydrates when your blood sugar is low, which includes:  3-4 GLUCOSE TABS  OR 3-4 OZ OF JUICE OR REGULAR SODA OR ONE TUBE OF GLUCOSE GEL    STEP 2: RECHECK blood sugar in 15 MINUTES STEP 3: If your blood sugar is still low at the 15 minute recheck --> then, go back to STEP 1 and treat AGAIN with another 15 grams of carbohydrates.

## 2022-03-15 NOTE — Progress Notes (Signed)
Name: Adalie Mand  Age/ Sex: 31 y.o., female   MRN/ DOB: [id number], [birth year]     PCP: Johna Roles, PA   Reason for Endocrinology Evaluation: Type 1 Diabetes Mellitus  Initial Endocrine Consultative Visit: 05/13/2019    PATIENT IDENTIFIER: [name] is a 31 y.o. female with a past medical history of T1DM, ADHD. The patient has followed with Endocrinology clinic since 05/13/2019 for consultative assistance with management of her diabetes.  DIABETIC HISTORY:  Ms. Mcelmurry was diagnosed with T1DM at age 29. Metformin caused Nausea. 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    Is seeing a therapist   Insurance did not cover Rybelsus   Ozempic started 05/2020  SUBJECTIVE:   During the last visit (05/18/2020): A1c 7.6 %     Today (03/15/2022): Ms. Lecrone is here for a follow up on diabetes.  She  has NOT been to our clinic in 22 months. checks her blood sugars 1 times daily. The patient has not had hypoglycemic episodes since the last clinic visit.   Denies nausea or vomiting nor diarrhea   HOME DIABETES REGIMEN:  - Lantus 28 units daily  - Humalog 10 units with each meal  - CF : Humalog ( BG - 130/35)  - Ozempic 0.25 mg weekly       METER DOWNLOAD SUMMARY:unable to download 14 day average 225  74 - 315   DIABETIC COMPLICATIONS: Microvascular complications:  Right retinal detachment, S/P cataract left 06/2021, right 02/2022 Denies: neuropathy, CKD Last eye exam: Completed 03/2022   Macrovascular complications:    Denies: CAD, PVD, CVA     HISTORY:  Past Medical History:  Past Medical History:  Diagnosis Date   Diabetes mellitus without complication ([place])    Type 1   Hypertension    Past Surgical History: No past surgical history on file. Social History:  reports that she has never smoked. She has never used smokeless tobacco. She reports current  alcohol use. She reports that she does not use drugs. Family History:  Family History  Problem Relation Age of Onset   Healthy Mother    Diabetes Father      HOME MEDICATIONS: Allergies as of 03/15/2022   No Known Allergies      Medication List        Accurate as of March 15, 2022  2:26 PM. If you have any questions, ask your nurse or doctor.          STOP taking these medications    Dexcom G6 Sensor Misc Stopped by: Dorita Sciara, MD   Dexcom G6 Transmitter Misc Stopped by: Dorita Sciara, MD   fluconazole 100 MG tablet Commonly known as: Diflucan Stopped by: Dorita Sciara, MD       TAKE these medications    amLODipine 5 MG tablet Commonly known as: NORVASC Take 5 mg by mouth daily.   Contour Next Test test strip Generic drug: glucose blood 4x daily   Glucagon Emergency 1 MG Kit Inject 1 mg as directed as needed (Hypoglycemia with altered level of consciousness, then call 911).   insulin lispro 100 UNIT/ML KwikPen Commonly known as: HUMALOG INJECT 10 UNITS INTO THE SKIN THREE TIMES DAILY. MAX DAILY DOSE OF 55 UNITS   Insulin Pen Needle 32G X 4 MM Misc 1 Device by Does not apply route in the morning, at noon, in  the evening, and at bedtime.   Lantus SoloStar 100 UNIT/ML Solostar Pen Generic drug: insulin glargine ADMINISTER 28 UNITS UNDER THE SKIN DAILY   losartan 25 MG tablet Commonly known as: COZAAR Take 25 mg by mouth daily.   MULTIVITAMIN ADULT PO Take 1 tablet by mouth daily.   Ozempic (0.25 or 0.5 MG/DOSE) 2 MG/1.5ML Sopn Generic drug: Semaglutide(0.25 or 0.5MG/DOS) Inject 0.5 mg into the skin once a week.         OBJECTIVE:   Vital Signs: BP 124/72 (BP Location: Left Arm, Patient Position: Sitting, Cuff Size: Large)   Pulse 80   Ht _0  (1.702 m)   Wt 220 lb (99.8 kg)   SpO2 96%   BMI 34.46 kg/m   Wt Readings from Last 3 Encounters:  03/15/22 220 lb (99.8 kg)  03/24/21 207 lb (93.9 kg)   05/18/20 200 lb 4 oz (90.8 kg)     Exam: General: Pt appears well and is in NAD  Neck: General: Supple without adenopathy. Thyroid: Thyroid size normal.  No goiter or nodules appreciated. No thyroid bruit.  Lungs: Clear with good BS bilat with no rales, rhonchi, or wheezes  Heart: RRR with normal S1 and S2 and no gallops; no murmurs; no rub  Abdomen: Normoactive bowel sounds, soft, nontender, without masses or organomegaly palpable  Extremities: No pretibial edema.   Neuro: MS is good with appropriate affect, pt is alert and Ox3    DM foot exam: 03/15/2022 The skin of the feet is intact without sores or ulcerations. The pedal pulses are 2+ on right and 2+ on left. The sensation is intact to a screening 5.07, 10 gram monofilament bilaterally      DATA REVIEWED:  Lab Results  Component Value Date   HGBA1C 9.3 (A) 03/15/2022   HGBA1C 7.6 (A) 05/18/2020   HGBA1C 8.1 (A) 12/23/2019   03/10/2022  BUN 8 Cr 0.88 GFR 90  ASSESSMENT / PLAN / RECOMMENDATIONS:   1 ) Type 1 Diabetes Mellitus, Poorly controlled, with red neuropathic complications  - Most recent A1c of 9.3 %. Goal A1c < 7.0 %.   -Patient continues with hyperglycemia - Declined insulin  pump in the past  - Intolerant to Metformin  - Will increase prandial insulin as below  - Dexcom prescription sent to the pharmacy, per patient this is on the formulary this year -I have recommended not to increase Ozempic at this time until her retinopathy is more stable   MEDICATIONS: -Continue Lantus 28 units daily  -Increase Humalog to 10 units with each meal  - CF : Humalog ( BG - 130/35) -Continue Ozempic 0.25 mg weekly  EDUCATION / INSTRUCTIONS: BG monitoring instructions: Patient is instructed to check her blood sugars 3 times a day, before meals and bedtime  Call [facility] if: BG persistently < 70 . I reviewed the Rule of 15 for the treatment of hypoglycemia in detail with the patient. Literature  supplied.    2) Diabetic complications:  Eye: Does have known diabetic retinopathy.  Neuro/ Feet: Does not have known diabetic peripheral neuropathy. Renal: Patient does not have known baseline CKD. She is not on an ACEI/ARB at present.  F/U in 6 months    Signed electronically by: Mack Guise, MD  [facility]  [facility] [address] [place], [place] [address] Phone: [phone] FAX: [phone]   CC: Johna Roles, [place] [address] [place] [address] Phone: [phone]  Fax: [phone]  Return to Endocrinology clinic as below: No future appointments.

## 2022-09-05 ENCOUNTER — Encounter: Payer: Self-pay | Admitting: Internal Medicine

## 2022-09-05 ENCOUNTER — Ambulatory Visit: Payer: Managed Care, Other (non HMO) | Admitting: Internal Medicine

## 2022-09-05 NOTE — Progress Notes (Deleted)
Name: Erica Buchanan  Age/ Sex: 32 y.o., female   MRN/ DOB: [id number], October 21, 1990     PCP: Delma Officer, PA   Reason for Endocrinology Evaluation: Type 1 Diabetes Mellitus  Initial Endocrine Consultative Visit: 05/13/2019    PATIENT IDENTIFIER: Erica Buchanan is a 32 y.o. female with a past medical history of T1DM, ADHD. The patient has followed with Endocrinology clinic since 05/13/2019 for consultative assistance with management of her diabetes.  DIABETIC HISTORY:  Erica Buchanan was diagnosed with T1DM at age 81. Metformin caused Nausea. Her hemoglobin A1c has ranged from 7's.% , peaking at 9.0%   Works at [employer] with brother.    Is seeing a therapist   Insurance did not cover Rybelsus   Ozempic started 05/2020  SUBJECTIVE:   During the last visit (03/15/2022): A1c 9.3 %     Today (09/05/2022): Erica Buchanan is here for a follow up on diabetes. She checks her blood sugars 1 times daily. The patient has not had hypoglycemic episodes since the last clinic visit.   Denies nausea or vomiting nor diarrhea   HOME DIABETES REGIMEN:  - Lantus 28 units daily  - Humalog 10 units with each meal  - CF : Humalog ( BG - 130/35)  - Ozempic 0.25 mg weekly       METER DOWNLOAD SUMMARY:unable to download 14 day average 225  74 - 349   DIABETIC COMPLICATIONS: Microvascular complications:  Right retinal detachment, S/P cataract left 06/2021, right 02/2022 Denies: neuropathy, CKD Last eye exam: Completed 05/24/2022   Macrovascular complications:    Denies: CAD, PVD, CVA     HISTORY:  Past Medical History:  Past Medical History:  Diagnosis Date   Diabetes mellitus without complication (HCC)    Type 1   Hypertension    Past Surgical History: No past surgical history on file. Social History:  reports that she has never smoked. She has never used smokeless tobacco. She reports current alcohol use. She reports that she does not  use drugs. Family History:  Family History  Problem Relation Age of Onset   Healthy Mother    Diabetes Father      HOME MEDICATIONS: Allergies as of 09/05/2022   No Known Allergies      Medication List        Accurate as of September 05, 2022 12:18 PM. If you have any questions, ask your nurse or doctor.          amLODipine 5 MG tablet Commonly known as: NORVASC Take 5 mg by mouth daily.   Contour Next Test test strip Generic drug: glucose blood 4x daily   Dexcom G7 Sensor Misc 1 Device by Does not apply route as directed.   Glucagon Emergency 1 MG Kit Inject 1 mg as directed as needed (Hypoglycemia with altered level of consciousness, then call 911).   HumaLOG KwikPen 200 UNIT/ML KwikPen Generic drug: insulin lispro Max daily 70 units   Insulin Pen Needle 32G X 4 MM Misc 1 Device by Does not apply route in the morning, at noon, in the evening, and at bedtime.   Lantus SoloStar 100 UNIT/ML Solostar Pen Generic drug: insulin glargine Inject 28 Units into the skin daily.   losartan 25 MG tablet Commonly known as: COZAAR Take 25 mg by mouth daily.   MULTIVITAMIN ADULT PO Take 1 tablet by mouth daily.   Ozempic (0.25 or 0.5 MG/DOSE) 2 MG/1.5ML Sopn Generic drug: Semaglutide(0.25 or  0.5MG /DOS) Inject 0.25 mg into the skin once a week.         OBJECTIVE:   Vital Signs: There were no vitals taken for this visit.  Wt Readings from Last 3 Encounters:  03/15/22 220 lb (99.8 kg)  03/24/21 207 lb (93.9 kg)  05/18/20 200 lb 4 oz (90.8 kg)     Exam: General: Pt appears well and is in NAD  Neck: General: Supple without adenopathy. Thyroid: Thyroid size normal.  No goiter or nodules appreciated. No thyroid bruit.  Lungs: Clear with good BS bilat   Heart: RRR   Abdomen: soft, nontender  Extremities: No pretibial edema.   Neuro: MS is good with appropriate affect, pt is alert and Ox3    DM foot exam: 03/15/2022 The skin of the feet is intact without sores  or ulcerations. The pedal pulses are 2+ on right and 2+ on left. The sensation is intact to a screening 5.07, 10 gram monofilament bilaterally      DATA REVIEWED:  Lab Results  Component Value Date   HGBA1C 9.3 (A) 03/15/2022   HGBA1C 7.6 (A) 05/18/2020   HGBA1C 8.1 (A) 12/23/2019   03/10/2022  BUN 8 Cr 0.88 GFR 90  ASSESSMENT / PLAN / RECOMMENDATIONS:   1 ) Type 1 Diabetes Mellitus, Poorly controlled, with red neuropathic complications  - Most recent A1c of 9.3 %. Goal A1c < 7.0 %.   -Patient continues with hyperglycemia - Declined insulin  pump in the past  - Intolerant to Metformin  - Will increase prandial insulin as below  - Dexcom prescription sent to the pharmacy, per patient this is on the formulary this year -I have recommended not to increase Ozempic at this time until her retinopathy is more stable   MEDICATIONS: -Continue Lantus 28 units daily  -Increase Humalog to 10 units with each meal  - CF : Humalog ( BG - 130/35) -Continue Ozempic 0.25 mg weekly  EDUCATION / INSTRUCTIONS: BG monitoring instructions: Patient is instructed to check her blood sugars 3 times a day, before meals and bedtime  Call [facility] if: BG persistently < 70 . I reviewed the Rule of 15 for the treatment of hypoglycemia in detail with the patient. Literature supplied.    2) Diabetic complications:  Eye: Does have known diabetic retinopathy.  Neuro/ Feet: Does not have known diabetic peripheral neuropathy. Renal: Patient does not have known baseline CKD. She is not on an ACEI/ARB at present.  F/U in 6 months    Signed electronically by: Lyndle Herrlich, MD  [facility]  [facility] [address] [place] [place], [place] [address] Phone: [phone] FAX: [phone]   CC: Delma Officer, [place] [address] [place] [place] [address] Phone: [phone]  Fax: [phone]  Return to Endocrinology clinic  as below: Future Appointments  Date Time Provider Department Center  09/05/2022  2:40 PM Asuna Peth, Konrad Dolores, MD [id number] None

## 2022-11-24 ENCOUNTER — Other Ambulatory Visit: Payer: Self-pay

## 2022-11-24 MED ORDER — HUMALOG KWIKPEN 200 UNIT/ML ~~LOC~~ SOPN: PEN_INJECTOR | SUBCUTANEOUS | 2 refills | Status: AC
